# Patient Record
Sex: Female | Born: 1940 | Race: White | Hispanic: No
Health system: Southern US, Community
[De-identification: ages and names within clinical notes are randomized; demographics above are authoritative.]

---

## 2004-06-30 ENCOUNTER — Ambulatory Visit: Payer: Self-pay | Admitting: Internal Medicine

## 2004-10-11 ENCOUNTER — Ambulatory Visit: Payer: Self-pay | Admitting: Internal Medicine

## 2005-10-24 ENCOUNTER — Ambulatory Visit: Payer: Self-pay | Admitting: Internal Medicine

## 2005-12-12 ENCOUNTER — Ambulatory Visit: Payer: Self-pay | Admitting: Internal Medicine

## 2006-04-30 ENCOUNTER — Emergency Department: Payer: Self-pay | Admitting: Emergency Medicine

## 2007-01-02 ENCOUNTER — Ambulatory Visit: Payer: Self-pay | Admitting: Family Medicine

## 2007-01-17 ENCOUNTER — Ambulatory Visit: Payer: Self-pay | Admitting: Gastroenterology

## 2007-09-12 ENCOUNTER — Ambulatory Visit: Payer: Self-pay | Admitting: Unknown Physician Specialty

## 2007-09-19 ENCOUNTER — Ambulatory Visit: Payer: Self-pay | Admitting: Unknown Physician Specialty

## 2007-12-02 ENCOUNTER — Ambulatory Visit: Payer: Self-pay | Admitting: Family Medicine

## 2008-05-09 ENCOUNTER — Ambulatory Visit: Payer: Self-pay | Admitting: Urology

## 2008-11-11 ENCOUNTER — Ambulatory Visit: Payer: Self-pay | Admitting: Nurse Practitioner

## 2009-04-28 ENCOUNTER — Ambulatory Visit: Payer: Self-pay | Admitting: Urology

## 2009-07-11 ENCOUNTER — Ambulatory Visit: Payer: Self-pay | Admitting: Internal Medicine

## 2010-05-03 ENCOUNTER — Ambulatory Visit: Payer: Self-pay | Admitting: General Practice

## 2011-02-04 ENCOUNTER — Ambulatory Visit: Payer: Self-pay

## 2011-02-15 ENCOUNTER — Ambulatory Visit: Payer: Self-pay | Admitting: Gynecologic Oncology

## 2011-02-16 LAB — CEA: CEA: 3.8 ng/mL (ref 0.0–4.7)

## 2011-02-21 ENCOUNTER — Ambulatory Visit: Payer: Self-pay | Admitting: Gastroenterology

## 2011-02-23 ENCOUNTER — Ambulatory Visit: Payer: Self-pay | Admitting: Gynecologic Oncology

## 2011-02-23 LAB — CBC
HCT: 37.4 % (ref 35.0–47.0)
HGB: 12.7 g/dL (ref 12.0–16.0)
MCHC: 33.9 g/dL (ref 32.0–36.0)
MCV: 81 fL (ref 80–100)
Platelet: 310 10*3/uL (ref 150–440)
RBC: 4.62 10*6/uL (ref 3.80–5.20)
RDW: 14.9 % — ABNORMAL HIGH (ref 11.5–14.5)

## 2011-02-23 LAB — BASIC METABOLIC PANEL
Anion Gap: 9 (ref 7–16)
BUN: 13 mg/dL (ref 7–18)
Calcium, Total: 7.9 mg/dL — ABNORMAL LOW (ref 8.5–10.1)
Co2: 28 mmol/L (ref 21–32)
Creatinine: 0.96 mg/dL (ref 0.60–1.30)
EGFR (African American): >60
Potassium: 3.6 mmol/L (ref 3.5–5.1)
Sodium: 138 mmol/L (ref 136–145)

## 2011-02-24 ENCOUNTER — Ambulatory Visit: Payer: Self-pay | Admitting: Oncology

## 2011-03-01 ENCOUNTER — Inpatient Hospital Stay: Payer: Self-pay | Admitting: Surgery

## 2011-03-01 LAB — CBC WITH DIFFERENTIAL/PLATELET
Basophil #: 0 10*3/uL (ref 0.0–0.1)
Eosinophil #: 0 10*3/uL (ref 0.0–0.7)
Eosinophil %: 0 %
HCT: 39.4 % (ref 35.0–47.0)
Lymphocyte %: 4.8 %
MCH: 28 pg (ref 26.0–34.0)
MCHC: 33.7 g/dL (ref 32.0–36.0)
MCV: 83 fL (ref 80–100)
Monocyte %: 5.1 %
Neutrophil %: 89.8 %
Platelet: 342 10*3/uL (ref 150–440)
RBC: 4.73 10*6/uL (ref 3.80–5.20)
RDW: 15.5 % — ABNORMAL HIGH (ref 11.5–14.5)
WBC: 13.3 10*3/uL — ABNORMAL HIGH (ref 3.6–11.0)

## 2011-03-02 LAB — CBC WITH DIFFERENTIAL/PLATELET
Basophil #: 0 10*3/uL (ref 0.0–0.1)
Basophil %: 0 %
Eosinophil #: 0 10*3/uL (ref 0.0–0.7)
HCT: 32.1 % — ABNORMAL LOW (ref 35.0–47.0)
Lymphocyte #: 0.5 10*3/uL — ABNORMAL LOW (ref 1.0–3.6)
Lymphocyte %: 3.9 %
MCH: 28 pg (ref 26.0–34.0)
MCHC: 33.4 g/dL (ref 32.0–36.0)
MCV: 84 fL (ref 80–100)
Neutrophil #: 10.7 10*3/uL — ABNORMAL HIGH (ref 1.4–6.5)
Platelet: 283 10*3/uL (ref 150–440)
RDW: 15.7 % — ABNORMAL HIGH (ref 11.5–14.5)

## 2011-03-02 LAB — BASIC METABOLIC PANEL
Anion Gap: 12 (ref 7–16)
BUN: 12 mg/dL (ref 7–18)
Calcium, Total: 6.4 mg/dL — CL (ref 8.5–10.1)
Creatinine: 1.29 mg/dL (ref 0.60–1.30)
EGFR (African American): 52 — ABNORMAL LOW
EGFR (Non-African Amer.): 43 — ABNORMAL LOW
Glucose: 215 mg/dL — ABNORMAL HIGH (ref 65–99)
Osmolality: 295 (ref 275–301)

## 2011-03-03 LAB — CBC WITH DIFFERENTIAL/PLATELET
Basophil #: 0 10*3/uL (ref 0.0–0.1)
Basophil %: 0.2 %
Eosinophil #: 0.1 10*3/uL (ref 0.0–0.7)
HGB: 9.1 g/dL — ABNORMAL LOW (ref 12.0–16.0)
Lymphocyte %: 5.7 %
MCHC: 33.2 g/dL (ref 32.0–36.0)
MCV: 84 fL (ref 80–100)
Neutrophil %: 85.8 %
Platelet: 229 10*3/uL (ref 150–440)
RDW: 15.7 % — ABNORMAL HIGH (ref 11.5–14.5)

## 2011-03-03 LAB — BASIC METABOLIC PANEL WITH GFR
Anion Gap: 9
BUN: 9 mg/dL
Calcium, Total: 6.8 mg/dL — CL
Chloride: 109 mmol/L — ABNORMAL HIGH
Co2: 25 mmol/L
Creatinine: 0.89 mg/dL
EGFR (African American): >60
EGFR (Non-African Amer.): >60
Glucose: 127 mg/dL — ABNORMAL HIGH
Osmolality: 285
Potassium: 3.6 mmol/L
Sodium: 143 mmol/L

## 2011-03-04 LAB — CBC WITH DIFFERENTIAL/PLATELET
Basophil %: 0.3 %
Eosinophil %: 3.7 %
HCT: 27.9 % — ABNORMAL LOW (ref 35.0–47.0)
HGB: 9.2 g/dL — ABNORMAL LOW (ref 12.0–16.0)
Lymphocyte #: 0.7 10*3/uL — ABNORMAL LOW (ref 1.0–3.6)
Lymphocyte %: 6.9 %
MCH: 27.7 pg (ref 26.0–34.0)
Monocyte #: 0.6 10*3/uL (ref 0.0–0.7)
Monocyte %: 6 %
Neutrophil #: 8 10*3/uL — ABNORMAL HIGH (ref 1.4–6.5)
Neutrophil %: 83.1 %
Platelet: 237 10*3/uL (ref 150–440)
RBC: 3.32 10*6/uL — ABNORMAL LOW (ref 3.80–5.20)
WBC: 9.6 10*3/uL (ref 3.6–11.0)

## 2011-03-04 LAB — BASIC METABOLIC PANEL
Calcium, Total: 7.5 mg/dL — ABNORMAL LOW (ref 8.5–10.1)
Chloride: 108 mmol/L — ABNORMAL HIGH (ref 98–107)
Osmolality: 281 (ref 275–301)
Potassium: 3.3 mmol/L — ABNORMAL LOW (ref 3.5–5.1)

## 2011-03-05 LAB — CBC WITH DIFFERENTIAL/PLATELET
Basophil #: 0.1 10*3/uL (ref 0.0–0.1)
Basophil %: 0.6 %
Eosinophil #: 0.4 10*3/uL (ref 0.0–0.7)
HCT: 29.4 % — ABNORMAL LOW (ref 35.0–47.0)
HGB: 9.9 g/dL — ABNORMAL LOW (ref 12.0–16.0)
Lymphocyte #: 0.7 10*3/uL — ABNORMAL LOW (ref 1.0–3.6)
Lymphocyte %: 7.3 %
MCH: 27.9 pg (ref 26.0–34.0)
MCHC: 33.7 g/dL (ref 32.0–36.0)
Monocyte #: 0.6 10*3/uL (ref 0.0–0.7)
Neutrophil %: 80.8 %
Platelet: 269 10*3/uL (ref 150–440)
RBC: 3.55 10*6/uL — ABNORMAL LOW (ref 3.80–5.20)
RDW: 15.4 % — ABNORMAL HIGH (ref 11.5–14.5)
WBC: 9.5 10*3/uL (ref 3.6–11.0)

## 2011-03-05 LAB — BASIC METABOLIC PANEL
Anion Gap: 9 (ref 7–16)
BUN: 4 mg/dL — ABNORMAL LOW (ref 7–18)
Creatinine: 0.67 mg/dL (ref 0.60–1.30)
EGFR (Non-African Amer.): >60
Osmolality: 283 (ref 275–301)
Sodium: 143 mmol/L (ref 136–145)

## 2011-03-06 LAB — CBC WITH DIFFERENTIAL/PLATELET
Basophil %: 0.5 %
Eosinophil #: 0.5 10*3/uL (ref 0.0–0.7)
Eosinophil %: 5.2 %
HCT: 32.8 % — ABNORMAL LOW (ref 35.0–47.0)
HGB: 10.8 g/dL — ABNORMAL LOW (ref 12.0–16.0)
Lymphocyte #: 0.9 10*3/uL — ABNORMAL LOW (ref 1.0–3.6)
Lymphocyte %: 9.2 %
MCV: 83 fL (ref 80–100)
Monocyte #: 0.8 10*3/uL — ABNORMAL HIGH (ref 0.0–0.7)
Neutrophil %: 76.7 %
RDW: 15.6 % — ABNORMAL HIGH (ref 11.5–14.5)
WBC: 9.5 10*3/uL (ref 3.6–11.0)

## 2011-03-06 LAB — BASIC METABOLIC PANEL
Anion Gap: 7 (ref 7–16)
BUN: 4 mg/dL — ABNORMAL LOW (ref 7–18)
Chloride: 103 mmol/L (ref 98–107)
Co2: 31 mmol/L (ref 21–32)
Creatinine: 0.81 mg/dL (ref 0.60–1.30)
EGFR (Non-African Amer.): >60
Osmolality: 279 (ref 275–301)
Potassium: 3.6 mmol/L (ref 3.5–5.1)
Sodium: 141 mmol/L (ref 136–145)

## 2011-03-15 ENCOUNTER — Ambulatory Visit: Payer: Self-pay | Admitting: Gynecologic Oncology

## 2011-03-15 LAB — COMPREHENSIVE METABOLIC PANEL
Albumin: 2.9 g/dL — ABNORMAL LOW (ref 3.4–5.0)
Alkaline Phosphatase: 101 U/L (ref 50–136)
Anion Gap: 7 (ref 7–16)
Bilirubin,Total: 0.3 mg/dL (ref 0.2–1.0)
Calcium, Total: 7.9 mg/dL — ABNORMAL LOW (ref 8.5–10.1)
Chloride: 103 mmol/L (ref 98–107)
Co2: 33 mmol/L — ABNORMAL HIGH (ref 21–32)
Creatinine: 1.12 mg/dL (ref 0.60–1.30)
EGFR (African American): >60
Glucose: 101 mg/dL — ABNORMAL HIGH (ref 65–99)
Osmolality: 286 (ref 275–301)
Sodium: 143 mmol/L (ref 136–145)

## 2011-03-15 LAB — CBC CANCER CENTER
Basophil %: 0.5 %
Eosinophil %: 5 %
HCT: 33 % — ABNORMAL LOW (ref 35.0–47.0)
HGB: 11.1 g/dL — ABNORMAL LOW (ref 12.0–16.0)
Lymphocyte %: 9.3 %
MCH: 27.7 pg (ref 26.0–34.0)
MCHC: 33.6 g/dL (ref 32.0–36.0)
Monocyte #: 0.8 x10 3/mm — ABNORMAL HIGH (ref 0.0–0.7)
Neutrophil #: 9.1 x10 3/mm — ABNORMAL HIGH (ref 1.4–6.5)
Neutrophil %: 78.6 %
Platelet: 536 x10 3/mm — ABNORMAL HIGH (ref 150–440)
RBC: 3.99 10*6/uL (ref 3.80–5.20)

## 2011-03-16 LAB — CA 125: CA 125: 50 U/mL — ABNORMAL HIGH (ref 0.0–34.0)

## 2011-03-17 ENCOUNTER — Ambulatory Visit: Payer: Self-pay | Admitting: Surgery

## 2011-03-17 LAB — CBC CANCER CENTER
Basophil #: 0.1 x10 3/mm (ref 0.0–0.1)
Eosinophil #: 0.7 x10 3/mm (ref 0.0–0.7)
Lymphocyte #: 1.3 x10 3/mm (ref 1.0–3.6)
Lymphocyte %: 15.9 %
MCV: 83 fL (ref 80–100)
Monocyte %: 7.2 %
Neutrophil %: 67.9 %
Platelet: 525 x10 3/mm — ABNORMAL HIGH (ref 150–440)
RDW: 15.7 % — ABNORMAL HIGH (ref 11.5–14.5)
WBC: 8.2 x10 3/mm (ref 3.6–11.0)

## 2011-03-17 LAB — URINALYSIS, COMPLETE
Bilirubin,UR: NEGATIVE
Blood: NEGATIVE
Ketone: NEGATIVE
Ph: 6 (ref 4.5–8.0)
Protein: 30
RBC,UR: 4 /HPF (ref 0–5)
Specific Gravity: 1.024 (ref 1.003–1.030)
Squamous Epithelial: 51
Transitional Epi: 1
WBC UR: 18 /HPF (ref 0–5)

## 2011-03-17 LAB — COMPREHENSIVE METABOLIC PANEL
Anion Gap: 7 (ref 7–16)
BUN: 15 mg/dL (ref 7–18)
Calcium, Total: 8 mg/dL — ABNORMAL LOW (ref 8.5–10.1)
Chloride: 104 mmol/L (ref 98–107)
Co2: 33 mmol/L — ABNORMAL HIGH (ref 21–32)
EGFR (African American): >60
EGFR (Non-African Amer.): 50 — ABNORMAL LOW
SGOT(AST): 23 U/L (ref 15–37)
SGPT (ALT): 32 U/L

## 2011-03-18 ENCOUNTER — Ambulatory Visit: Payer: Self-pay | Admitting: Oncology

## 2011-03-18 LAB — PROTIME-INR
INR: 0.9
Prothrombin Time: 12.9 secs (ref 11.5–14.7)

## 2011-03-21 LAB — MAGNESIUM: Magnesium: 2.2 mg/dL

## 2011-03-21 LAB — BILIRUBIN, DIRECT: Bilirubin, Direct: 0.1 mg/dL (ref 0.00–0.20)

## 2011-03-25 LAB — CA 125: CA 125: 70.3 U/mL — ABNORMAL HIGH (ref 0.0–34.0)

## 2011-04-14 LAB — CBC CANCER CENTER
Basophil %: 0 %
Eosinophil #: 0 x10 3/mm (ref 0.0–0.7)
Eosinophil %: 0 %
HCT: 33.9 % — ABNORMAL LOW (ref 35.0–47.0)
Lymphocyte #: 0.7 x10 3/mm — ABNORMAL LOW (ref 1.0–3.6)
Lymphocyte %: 7.6 %
MCHC: 33.6 g/dL (ref 32.0–36.0)
Monocyte #: 0.1 x10 3/mm (ref 0.0–0.7)
Monocyte %: 1.1 %
Neutrophil #: 8 x10 3/mm — ABNORMAL HIGH (ref 1.4–6.5)
Neutrophil %: 91.3 %
RDW: 15.4 % — ABNORMAL HIGH (ref 11.5–14.5)

## 2011-04-14 LAB — COMPREHENSIVE METABOLIC PANEL
Albumin: 3.4 g/dL (ref 3.4–5.0)
Alkaline Phosphatase: 99 U/L (ref 50–136)
Anion Gap: 14 (ref 7–16)
BUN: 14 mg/dL (ref 7–18)
Bilirubin,Total: 0.3 mg/dL (ref 0.2–1.0)
Calcium, Total: 8.5 mg/dL (ref 8.5–10.1)
Co2: 26 mmol/L (ref 21–32)
Creatinine: 1.13 mg/dL (ref 0.60–1.30)
EGFR (Non-African Amer.): 50 — ABNORMAL LOW
Osmolality: 292 (ref 275–301)
SGOT(AST): 17 U/L (ref 15–37)
SGPT (ALT): 32 U/L

## 2011-04-14 LAB — MAGNESIUM: Magnesium: 2 mg/dL

## 2011-04-14 LAB — BILIRUBIN, DIRECT: Bilirubin, Direct: 0.1 mg/dL (ref 0.00–0.20)

## 2011-04-18 ENCOUNTER — Ambulatory Visit: Payer: Self-pay | Admitting: Oncology

## 2011-05-05 LAB — CBC CANCER CENTER
Basophil %: 0.2 %
Eosinophil #: 0 x10 3/mm (ref 0.0–0.7)
Eosinophil %: 0 %
HCT: 36.3 % (ref 35.0–47.0)
HGB: 11.5 g/dL — ABNORMAL LOW (ref 12.0–16.0)
Lymphocyte %: 8.3 %
MCH: 24.7 pg — ABNORMAL LOW (ref 26.0–34.0)
MCHC: 31.6 g/dL — ABNORMAL LOW (ref 32.0–36.0)
MCV: 78 fL — ABNORMAL LOW (ref 80–100)
Monocyte %: 0.7 %
Neutrophil #: 6.3 x10 3/mm (ref 1.4–6.5)
Neutrophil %: 90.8 %
Platelet: 417 x10 3/mm (ref 150–440)
RDW: 16.3 % — ABNORMAL HIGH (ref 11.5–14.5)
WBC: 6.9 x10 3/mm (ref 3.6–11.0)

## 2011-05-05 LAB — COMPREHENSIVE METABOLIC PANEL
Albumin: 3.6 g/dL (ref 3.4–5.0)
BUN: 15 mg/dL (ref 7–18)
Bilirubin,Total: 0.4 mg/dL (ref 0.2–1.0)
Calcium, Total: 8.8 mg/dL (ref 8.5–10.1)
Co2: 24 mmol/L (ref 21–32)
Creatinine: 1.16 mg/dL (ref 0.60–1.30)
EGFR (African American): 54 — ABNORMAL LOW
EGFR (Non-African Amer.): 47 — ABNORMAL LOW
Glucose: 243 mg/dL — ABNORMAL HIGH (ref 65–99)
Osmolality: 292 (ref 275–301)
Potassium: 4 mmol/L (ref 3.5–5.1)
SGOT(AST): 18 U/L (ref 15–37)
Sodium: 142 mmol/L (ref 136–145)

## 2011-05-05 LAB — URINALYSIS, COMPLETE
Glucose,UR: NEGATIVE mg/dL (ref 0–75)
Ph: 5 (ref 4.5–8.0)
Squamous Epithelial: 10
WBC UR: 5 /HPF (ref 0–5)

## 2011-05-05 LAB — BILIRUBIN, DIRECT: Bilirubin, Direct: <0.1 mg/dL (ref 0.00–0.20)

## 2011-05-05 LAB — MAGNESIUM: Magnesium: 1.7 mg/dL — ABNORMAL LOW

## 2011-05-18 ENCOUNTER — Ambulatory Visit: Payer: Self-pay | Admitting: Oncology

## 2011-05-25 LAB — CBC CANCER CENTER
Basophil #: 0.1 x10 3/mm (ref 0.0–0.1)
Basophil %: 1.2 %
Eosinophil #: 0 x10 3/mm (ref 0.0–0.7)
HGB: 11.3 g/dL — ABNORMAL LOW (ref 12.0–16.0)
MCH: 25.5 pg — ABNORMAL LOW (ref 26.0–34.0)
MCV: 77 fL — ABNORMAL LOW (ref 80–100)
Monocyte #: 0.6 x10 3/mm (ref 0.2–0.9)
Monocyte %: 10.1 %
Neutrophil #: 3.9 x10 3/mm (ref 1.4–6.5)
Neutrophil %: 65.5 %
Platelet: 301 x10 3/mm (ref 150–440)
RBC: 4.45 10*6/uL (ref 3.80–5.20)

## 2011-05-25 LAB — COMPREHENSIVE METABOLIC PANEL
Albumin: 3.4 g/dL (ref 3.4–5.0)
Anion Gap: 11 (ref 7–16)
BUN: 8 mg/dL (ref 7–18)
Calcium, Total: 8.1 mg/dL — ABNORMAL LOW (ref 8.5–10.1)
Creatinine: 0.94 mg/dL (ref 0.60–1.30)
EGFR (Non-African Amer.): >60
Glucose: 109 mg/dL — ABNORMAL HIGH (ref 65–99)
Osmolality: 286 (ref 275–301)
Potassium: 3.4 mmol/L — ABNORMAL LOW (ref 3.5–5.1)
SGOT(AST): 19 U/L (ref 15–37)
SGPT (ALT): 28 U/L
Sodium: 144 mmol/L (ref 136–145)
Total Protein: 6.5 g/dL (ref 6.4–8.2)

## 2011-05-25 LAB — MAGNESIUM: Magnesium: 1.9 mg/dL

## 2011-05-25 LAB — BILIRUBIN, DIRECT: Bilirubin, Direct: 0.1 mg/dL (ref 0.00–0.20)

## 2011-06-16 LAB — COMPREHENSIVE METABOLIC PANEL
Albumin: 3.4 g/dL (ref 3.4–5.0)
Alkaline Phosphatase: 120 U/L (ref 50–136)
BUN: 18 mg/dL (ref 7–18)
Calcium, Total: 8.5 mg/dL (ref 8.5–10.1)
Chloride: 101 mmol/L (ref 98–107)
Co2: 27 mmol/L (ref 21–32)
Creatinine: 0.93 mg/dL (ref 0.60–1.30)
EGFR (African American): >60
EGFR (Non-African Amer.): >60
Glucose: 211 mg/dL — ABNORMAL HIGH (ref 65–99)
Osmolality: 286 (ref 275–301)
Potassium: 4.1 mmol/L (ref 3.5–5.1)
SGPT (ALT): 32 U/L
Total Protein: 7.1 g/dL (ref 6.4–8.2)

## 2011-06-16 LAB — PHOSPHORUS: Phosphorus: 3.4 mg/dL (ref 2.5–4.9)

## 2011-06-16 LAB — BILIRUBIN, DIRECT: Bilirubin, Direct: 0.1 mg/dL (ref 0.00–0.20)

## 2011-06-16 LAB — URINALYSIS, COMPLETE
Bilirubin,UR: NEGATIVE
Blood: NEGATIVE
Glucose,UR: >=500 mg/dL (ref 0–75)
Leukocyte Esterase: NEGATIVE
Nitrite: NEGATIVE
Ph: 5 (ref 4.5–8.0)
Protein: NEGATIVE
Specific Gravity: 1.02 (ref 1.003–1.030)
Squamous Epithelial: 2

## 2011-06-16 LAB — CBC CANCER CENTER
Eosinophil #: 0 x10 3/mm (ref 0.0–0.7)
Eosinophil %: 0 %
HCT: 34.7 % — ABNORMAL LOW (ref 35.0–47.0)
HGB: 11.2 g/dL — ABNORMAL LOW (ref 12.0–16.0)
MCH: 24.7 pg — ABNORMAL LOW (ref 26.0–34.0)
MCHC: 32.2 g/dL (ref 32.0–36.0)
MCV: 77 fL — ABNORMAL LOW (ref 80–100)
Monocyte #: 0.1 x10 3/mm — ABNORMAL LOW (ref 0.2–0.9)
Neutrophil %: 91.3 %
RBC: 4.53 10*6/uL (ref 3.80–5.20)
RDW: 18.7 % — ABNORMAL HIGH (ref 11.5–14.5)

## 2011-06-16 LAB — MAGNESIUM: Magnesium: 1.7 mg/dL — ABNORMAL LOW

## 2011-06-17 LAB — CA 125: CA 125: 12 U/mL (ref 0.0–34.0)

## 2011-06-18 ENCOUNTER — Ambulatory Visit: Payer: Self-pay | Admitting: Oncology

## 2011-07-07 LAB — CBC CANCER CENTER
Basophil #: 0.1 x10 3/mm (ref 0.0–0.1)
Eosinophil %: 0.4 %
HCT: 34.2 % — ABNORMAL LOW (ref 35.0–47.0)
HGB: 11.2 g/dL — ABNORMAL LOW (ref 12.0–16.0)
Lymphocyte #: 1 x10 3/mm (ref 1.0–3.6)
Lymphocyte %: 14.6 %
MCV: 79 fL — ABNORMAL LOW (ref 80–100)
Monocyte %: 5.8 %
Neutrophil #: 5.4 x10 3/mm (ref 1.4–6.5)
Neutrophil %: 78.5 %
Platelet: 202 x10 3/mm (ref 150–440)
RDW: 19.5 % — ABNORMAL HIGH (ref 11.5–14.5)
WBC: 6.9 x10 3/mm (ref 3.6–11.0)

## 2011-07-07 LAB — COMPREHENSIVE METABOLIC PANEL
Albumin: 3.4 g/dL (ref 3.4–5.0)
Anion Gap: 8 (ref 7–16)
Bilirubin,Total: 0.3 mg/dL (ref 0.2–1.0)
Creatinine: 0.79 mg/dL (ref 0.60–1.30)
Osmolality: 282 (ref 275–301)
Potassium: 3.9 mmol/L (ref 3.5–5.1)
SGOT(AST): 27 U/L (ref 15–37)
SGPT (ALT): 32 U/L
Sodium: 141 mmol/L (ref 136–145)
Total Protein: 6.7 g/dL (ref 6.4–8.2)

## 2011-07-07 LAB — MAGNESIUM: Magnesium: 1.9 mg/dL

## 2011-07-07 LAB — BILIRUBIN, DIRECT: Bilirubin, Direct: <0.1 mg/dL (ref 0.00–0.20)

## 2011-07-18 ENCOUNTER — Ambulatory Visit: Payer: Self-pay | Admitting: Oncology

## 2011-07-27 LAB — CREATININE, SERUM: Creatinine: 0.89 mg/dL (ref 0.60–1.30)

## 2011-07-28 LAB — PHOSPHORUS: Phosphorus: 4.4 mg/dL (ref 2.5–4.9)

## 2011-07-28 LAB — COMPREHENSIVE METABOLIC PANEL
Albumin: 3.5 g/dL (ref 3.4–5.0)
Alkaline Phosphatase: 117 U/L (ref 50–136)
BUN: 13 mg/dL (ref 7–18)
Chloride: 102 mmol/L (ref 98–107)
Co2: 33 mmol/L — ABNORMAL HIGH (ref 21–32)
Creatinine: 0.88 mg/dL (ref 0.60–1.30)
EGFR (Non-African Amer.): >60
Glucose: 84 mg/dL (ref 65–99)
Osmolality: 284 (ref 275–301)
SGOT(AST): 16 U/L (ref 15–37)
SGPT (ALT): 26 U/L
Sodium: 143 mmol/L (ref 136–145)
Total Protein: 6.8 g/dL (ref 6.4–8.2)

## 2011-07-28 LAB — URINALYSIS, COMPLETE
Blood: NEGATIVE
Glucose,UR: NEGATIVE mg/dL (ref 0–75)
RBC,UR: 5 /HPF (ref 0–5)
Squamous Epithelial: 40

## 2011-07-28 LAB — CBC CANCER CENTER
Basophil %: 0.7 %
Eosinophil #: 0 x10 3/mm (ref 0.0–0.7)
Eosinophil %: 0.3 %
HCT: 33.5 % — ABNORMAL LOW (ref 35.0–47.0)
HGB: 11 g/dL — ABNORMAL LOW (ref 12.0–16.0)
Lymphocyte #: 1.3 x10 3/mm (ref 1.0–3.6)
MCHC: 32.9 g/dL (ref 32.0–36.0)
MCV: 80 fL (ref 80–100)
Monocyte %: 12.9 %
Neutrophil #: 3.1 x10 3/mm (ref 1.4–6.5)
Platelet: 207 x10 3/mm (ref 150–440)
RBC: 4.22 10*6/uL (ref 3.80–5.20)
WBC: 5.1 x10 3/mm (ref 3.6–11.0)

## 2011-07-28 LAB — BILIRUBIN, DIRECT: Bilirubin, Direct: 0.1 mg/dL (ref 0.00–0.20)

## 2011-08-03 LAB — PROTEIN, URINE, 24 HOUR: Protein, Urine: <5 mg/dL — ABNORMAL LOW (ref 0–12)

## 2011-08-18 ENCOUNTER — Ambulatory Visit: Payer: Self-pay | Admitting: Oncology

## 2011-08-25 LAB — BASIC METABOLIC PANEL
Anion Gap: 11 (ref 7–16)
BUN: 16 mg/dL (ref 7–18)
Calcium, Total: 8.6 mg/dL (ref 8.5–10.1)
Chloride: 102 mmol/L (ref 98–107)
Co2: 30 mmol/L (ref 21–32)
Creatinine: 1.04 mg/dL (ref 0.60–1.30)
EGFR (African American): >60
EGFR (Non-African Amer.): 53 — ABNORMAL LOW
Glucose: 120 mg/dL — ABNORMAL HIGH (ref 65–99)
Sodium: 143 mmol/L (ref 136–145)

## 2011-09-01 LAB — POTASSIUM: Potassium: 3.8 mmol/L (ref 3.5–5.1)

## 2011-09-15 ENCOUNTER — Ambulatory Visit: Payer: Self-pay | Admitting: Internal Medicine

## 2011-09-18 ENCOUNTER — Ambulatory Visit: Payer: Self-pay | Admitting: Oncology

## 2011-09-22 LAB — BASIC METABOLIC PANEL
Anion Gap: 6 — ABNORMAL LOW (ref 7–16)
Calcium, Total: 8.2 mg/dL — ABNORMAL LOW (ref 8.5–10.1)
Chloride: 103 mmol/L (ref 98–107)
Co2: 32 mmol/L (ref 21–32)
Creatinine: 0.98 mg/dL (ref 0.60–1.30)
EGFR (African American): >60
Osmolality: 282 (ref 275–301)

## 2011-09-22 LAB — CBC CANCER CENTER
Basophil #: 0.1 x10 3/mm (ref 0.0–0.1)
HCT: 35.5 % (ref 35.0–47.0)
Lymphocyte #: 1.4 x10 3/mm (ref 1.0–3.6)
Lymphocyte %: 23 %
MCHC: 32.6 g/dL (ref 32.0–36.0)
MCV: 79 fL — ABNORMAL LOW (ref 80–100)
Monocyte %: 8.7 %
Neutrophil %: 61.1 %
Platelet: 235 x10 3/mm (ref 150–440)
RBC: 4.48 10*6/uL (ref 3.80–5.20)
RDW: 15.7 % — ABNORMAL HIGH (ref 11.5–14.5)
WBC: 6 x10 3/mm (ref 3.6–11.0)

## 2011-09-29 LAB — POTASSIUM: Potassium: 3.8 mmol/L (ref 3.5–5.1)

## 2011-10-07 LAB — CA 125: CA 125: 8.5 U/mL (ref 0.0–34.0)

## 2011-10-18 ENCOUNTER — Ambulatory Visit: Payer: Self-pay | Admitting: Oncology

## 2011-10-20 LAB — URINALYSIS, COMPLETE
Bacteria: NONE SEEN
Bilirubin,UR: NEGATIVE
Blood: NEGATIVE
Glucose,UR: NEGATIVE mg/dL (ref 0–75)
Leukocyte Esterase: NEGATIVE
Nitrite: NEGATIVE
Ph: 6 (ref 4.5–8.0)
Squamous Epithelial: <1
WBC UR: 2 /HPF (ref 0–5)

## 2011-10-20 LAB — CBC CANCER CENTER
Basophil %: 1 %
Eosinophil %: 3.9 %
HCT: 35.5 % (ref 35.0–47.0)
HGB: 11.6 g/dL — ABNORMAL LOW (ref 12.0–16.0)
Lymphocyte #: 1.3 x10 3/mm (ref 1.0–3.6)
MCH: 25.6 pg — ABNORMAL LOW (ref 26.0–34.0)
MCV: 78 fL — ABNORMAL LOW (ref 80–100)
Monocyte #: 0.5 x10 3/mm (ref 0.2–0.9)
RBC: 4.53 10*6/uL (ref 3.80–5.20)

## 2011-10-20 LAB — COMPREHENSIVE METABOLIC PANEL
Alkaline Phosphatase: 98 U/L (ref 50–136)
Anion Gap: 14 (ref 7–16)
Bilirubin,Total: 0.3 mg/dL (ref 0.2–1.0)
Calcium, Total: 8.4 mg/dL — ABNORMAL LOW (ref 8.5–10.1)
Chloride: 105 mmol/L (ref 98–107)
EGFR (African American): >60
EGFR (Non-African Amer.): >60
Glucose: 90 mg/dL (ref 65–99)
Osmolality: 288 (ref 275–301)
Potassium: 3.4 mmol/L — ABNORMAL LOW (ref 3.5–5.1)
SGOT(AST): 12 U/L — ABNORMAL LOW (ref 15–37)
SGPT (ALT): 18 U/L (ref 12–78)

## 2011-10-20 LAB — BILIRUBIN, DIRECT: Bilirubin, Direct: 0.1 mg/dL (ref 0.00–0.20)

## 2011-10-20 LAB — PHOSPHORUS: Phosphorus: 4.3 mg/dL (ref 2.5–4.9)

## 2011-10-21 LAB — CA 125: CA 125: 8.1 U/mL (ref 0.0–34.0)

## 2011-11-17 LAB — POTASSIUM: Potassium: 3.7 mmol/L (ref 3.5–5.1)

## 2011-11-18 ENCOUNTER — Ambulatory Visit: Payer: Self-pay | Admitting: Oncology

## 2011-12-14 LAB — POTASSIUM: Potassium: 3.3 mmol/L — ABNORMAL LOW (ref 3.5–5.1)

## 2011-12-18 ENCOUNTER — Ambulatory Visit: Payer: Self-pay | Admitting: Oncology

## 2012-01-05 LAB — POTASSIUM: Potassium: 3.6 mmol/L (ref 3.5–5.1)

## 2012-01-09 LAB — CREATININE, SERUM
EGFR (African American): >60
EGFR (Non-African Amer.): >60

## 2012-01-12 LAB — COMPREHENSIVE METABOLIC PANEL
Alkaline Phosphatase: 108 U/L (ref 50–136)
Anion Gap: 10 (ref 7–16)
Bilirubin,Total: 0.4 mg/dL (ref 0.2–1.0)
Calcium, Total: 8.1 mg/dL — ABNORMAL LOW (ref 8.5–10.1)
Chloride: 105 mmol/L (ref 98–107)
Co2: 29 mmol/L (ref 21–32)
EGFR (African American): >60
EGFR (Non-African Amer.): >60
Glucose: 90 mg/dL (ref 65–99)
Potassium: 3.5 mmol/L (ref 3.5–5.1)
SGOT(AST): 14 U/L — ABNORMAL LOW (ref 15–37)
SGPT (ALT): 22 U/L (ref 12–78)
Total Protein: 6.3 g/dL — ABNORMAL LOW (ref 6.4–8.2)

## 2012-01-12 LAB — CBC CANCER CENTER
Basophil #: 0.1 x10 3/mm (ref 0.0–0.1)
Basophil %: 0.9 %
Eosinophil #: 0.3 x10 3/mm (ref 0.0–0.7)
Lymphocyte #: 1.3 x10 3/mm (ref 1.0–3.6)
Lymphocyte %: 22.4 %
MCHC: 33.3 g/dL (ref 32.0–36.0)
MCV: 76 fL — ABNORMAL LOW (ref 80–100)
Monocyte #: 0.5 x10 3/mm (ref 0.2–0.9)
Monocyte %: 8.7 %
Neutrophil #: 3.5 x10 3/mm (ref 1.4–6.5)
Platelet: 230 x10 3/mm (ref 150–440)
RBC: 4.63 10*6/uL (ref 3.80–5.20)
RDW: 15.4 % — ABNORMAL HIGH (ref 11.5–14.5)
WBC: 5.7 x10 3/mm (ref 3.6–11.0)

## 2012-01-12 LAB — URINALYSIS, COMPLETE
Bilirubin,UR: NEGATIVE
Blood: NEGATIVE
Glucose,UR: NEGATIVE mg/dL (ref 0–75)
Leukocyte Esterase: NEGATIVE
Nitrite: NEGATIVE
Ph: 6 (ref 4.5–8.0)
Protein: NEGATIVE
RBC,UR: 1 /HPF (ref 0–5)
Squamous Epithelial: 2

## 2012-01-12 LAB — MAGNESIUM: Magnesium: 1.9 mg/dL

## 2012-01-12 LAB — BILIRUBIN, DIRECT: Bilirubin, Direct: 0.1 mg/dL (ref 0.00–0.20)

## 2012-01-18 ENCOUNTER — Ambulatory Visit: Payer: Self-pay | Admitting: Oncology

## 2012-02-18 ENCOUNTER — Ambulatory Visit: Payer: Self-pay | Admitting: Oncology

## 2012-03-08 LAB — POTASSIUM: Potassium: 4.2 mmol/L (ref 3.5–5.1)

## 2012-03-17 ENCOUNTER — Ambulatory Visit: Payer: Self-pay | Admitting: Oncology

## 2012-03-29 LAB — BASIC METABOLIC PANEL
Calcium, Total: 7.7 mg/dL — ABNORMAL LOW (ref 8.5–10.1)
Chloride: 104 mmol/L (ref 98–107)
Co2: 32 mmol/L (ref 21–32)
Creatinine: 1.04 mg/dL (ref 0.60–1.30)
EGFR (African American): >60
Glucose: 96 mg/dL (ref 65–99)

## 2012-04-11 ENCOUNTER — Ambulatory Visit: Payer: Self-pay | Admitting: Oncology

## 2012-04-12 LAB — COMPREHENSIVE METABOLIC PANEL
Alkaline Phosphatase: 131 U/L (ref 50–136)
Anion Gap: 6 — ABNORMAL LOW (ref 7–16)
BUN: 12 mg/dL (ref 7–18)
Bilirubin,Total: 0.4 mg/dL (ref 0.2–1.0)
Calcium, Total: 8.4 mg/dL — ABNORMAL LOW (ref 8.5–10.1)
Chloride: 105 mmol/L (ref 98–107)
Creatinine: 0.99 mg/dL (ref 0.60–1.30)
EGFR (Non-African Amer.): 57 — ABNORMAL LOW
Glucose: 161 mg/dL — ABNORMAL HIGH (ref 65–99)
Potassium: 3.1 mmol/L — ABNORMAL LOW (ref 3.5–5.1)
SGOT(AST): 27 U/L (ref 15–37)

## 2012-04-12 LAB — URINALYSIS, COMPLETE
Glucose,UR: NEGATIVE mg/dL (ref 0–75)
Ketone: NEGATIVE
Leukocyte Esterase: NEGATIVE
Nitrite: NEGATIVE
RBC,UR: 5 /HPF (ref 0–5)

## 2012-04-12 LAB — CBC CANCER CENTER
Eosinophil %: 2.1 %
HGB: 11.5 g/dL — ABNORMAL LOW (ref 12.0–16.0)
Lymphocyte %: 13.1 %
MCV: 75 fL — ABNORMAL LOW (ref 80–100)
Monocyte #: 0.3 x10 3/mm (ref 0.2–0.9)
Neutrophil #: 4.2 x10 3/mm (ref 1.4–6.5)
Neutrophil %: 77.5 %
RBC: 4.61 10*6/uL (ref 3.80–5.20)
WBC: 5.5 x10 3/mm (ref 3.6–11.0)

## 2012-04-12 LAB — BILIRUBIN, DIRECT: Bilirubin, Direct: <0.1 mg/dL (ref 0.00–0.20)

## 2012-04-12 LAB — PHOSPHORUS: Phosphorus: 4.1 mg/dL (ref 2.5–4.9)

## 2012-04-17 ENCOUNTER — Ambulatory Visit: Payer: Self-pay | Admitting: Oncology

## 2012-04-30 LAB — URINALYSIS, COMPLETE
Leukocyte Esterase: NEGATIVE
Nitrite: NEGATIVE
Ph: 6 (ref 4.5–8.0)
Protein: 30
RBC,UR: 3 /HPF (ref 0–5)
Specific Gravity: 1.025 (ref 1.003–1.030)
Squamous Epithelial: 4
WBC UR: 2 /HPF (ref 0–5)

## 2012-04-30 LAB — MAGNESIUM: Magnesium: 1.8 mg/dL

## 2012-04-30 LAB — COMPREHENSIVE METABOLIC PANEL
Albumin: 3.1 g/dL — ABNORMAL LOW (ref 3.4–5.0)
Alkaline Phosphatase: 147 U/L — ABNORMAL HIGH (ref 50–136)
BUN: 11 mg/dL (ref 7–18)
Bilirubin,Total: 0.7 mg/dL (ref 0.2–1.0)
Calcium, Total: 8.1 mg/dL — ABNORMAL LOW (ref 8.5–10.1)
Chloride: 102 mmol/L (ref 98–107)
Creatinine: 0.98 mg/dL (ref 0.60–1.30)
EGFR (African American): >60
EGFR (Non-African Amer.): 57 — ABNORMAL LOW
Glucose: 95 mg/dL (ref 65–99)
SGOT(AST): 21 U/L (ref 15–37)
Sodium: 139 mmol/L (ref 136–145)

## 2012-04-30 LAB — PHOSPHORUS: Phosphorus: 4.2 mg/dL (ref 2.5–4.9)

## 2012-04-30 LAB — CBC CANCER CENTER
Eosinophil #: 0.2 x10 3/mm (ref 0.0–0.7)
HCT: 35.2 % (ref 35.0–47.0)
HGB: 11.4 g/dL — ABNORMAL LOW (ref 12.0–16.0)
Lymphocyte #: 0.8 x10 3/mm — ABNORMAL LOW (ref 1.0–3.6)
MCH: 24.3 pg — ABNORMAL LOW (ref 26.0–34.0)
MCHC: 32.3 g/dL (ref 32.0–36.0)
MCV: 75 fL — ABNORMAL LOW (ref 80–100)
Monocyte #: 0.7 x10 3/mm (ref 0.2–0.9)
Monocyte %: 9.5 %
Neutrophil #: 5.2 x10 3/mm (ref 1.4–6.5)
Neutrophil %: 75.5 %
RBC: 4.69 10*6/uL (ref 3.80–5.20)
RDW: 16.4 % — ABNORMAL HIGH (ref 11.5–14.5)

## 2012-04-30 LAB — PROTIME-INR
INR: 1.1
Prothrombin Time: 14 secs (ref 11.5–14.7)

## 2012-04-30 LAB — APTT: Activated PTT: 33.7 secs (ref 23.6–35.9)

## 2012-04-30 LAB — BILIRUBIN, DIRECT: Bilirubin, Direct: 0.2 mg/dL (ref 0.00–0.20)

## 2012-05-01 LAB — CA 125: CA 125: 157.3 U/mL — ABNORMAL HIGH (ref 0.0–34.0)

## 2012-05-02 LAB — PROTIME-INR
INR: 1.1
Prothrombin Time: 14.1 secs (ref 11.5–14.7)

## 2012-05-04 ENCOUNTER — Emergency Department: Payer: Self-pay | Admitting: Emergency Medicine

## 2012-05-04 LAB — CBC
HCT: 35.1 % (ref 35.0–47.0)
HGB: 11.5 g/dL — ABNORMAL LOW (ref 12.0–16.0)
MCH: 25 pg — ABNORMAL LOW (ref 26.0–34.0)
MCHC: 32.9 g/dL (ref 32.0–36.0)
MCV: 76 fL — ABNORMAL LOW (ref 80–100)
Platelet: 270 10*3/uL (ref 150–440)
RDW: 16 % — ABNORMAL HIGH (ref 11.5–14.5)
WBC: 7.5 10*3/uL (ref 3.6–11.0)

## 2012-05-04 LAB — COMPREHENSIVE METABOLIC PANEL
Albumin: 2.6 g/dL — ABNORMAL LOW (ref 3.4–5.0)
Alkaline Phosphatase: 127 U/L (ref 50–136)
Bilirubin,Total: 0.9 mg/dL (ref 0.2–1.0)
Chloride: 104 mmol/L (ref 98–107)
Co2: 26 mmol/L (ref 21–32)
EGFR (Non-African Amer.): >60
Osmolality: 275 (ref 275–301)
SGOT(AST): 34 U/L (ref 15–37)
SGPT (ALT): 31 U/L (ref 12–78)
Sodium: 137 mmol/L (ref 136–145)
Total Protein: 5.9 g/dL — ABNORMAL LOW (ref 6.4–8.2)

## 2012-05-04 LAB — LACTATE DEHYDROGENASE, PLEURAL OR PERITONEAL FLUID: LDH, Body Fluid: 203 U/L

## 2012-05-04 LAB — TROPONIN I: Troponin-I: <0.02 ng/mL

## 2012-05-04 LAB — PRO B NATRIURETIC PEPTIDE: B-Type Natriuretic Peptide: 831 pg/mL — ABNORMAL HIGH (ref 0–125)

## 2012-05-04 LAB — PROTEIN, BODY FLUID: Protein, Body Fluid: 3.8 g/dL

## 2012-05-04 LAB — LACTATE DEHYDROGENASE: LDH: 205 U/L (ref 81–246)

## 2012-05-08 LAB — BODY FLUID CULTURE

## 2012-05-09 LAB — CBC CANCER CENTER
Basophil #: 0 x10 3/mm (ref 0.0–0.1)
Eosinophil #: 0.1 x10 3/mm (ref 0.0–0.7)
Eosinophil %: 1.5 %
MCH: 24.4 pg — ABNORMAL LOW (ref 26.0–34.0)
MCHC: 32.9 g/dL (ref 32.0–36.0)
MCV: 74 fL — ABNORMAL LOW (ref 80–100)
Monocyte #: 0.3 x10 3/mm (ref 0.2–0.9)
Platelet: 105 x10 3/mm — ABNORMAL LOW (ref 150–440)
RBC: 4.59 10*6/uL (ref 3.80–5.20)
RDW: 15.9 % — ABNORMAL HIGH (ref 11.5–14.5)

## 2012-05-10 LAB — CBC CANCER CENTER
Basophil #: 0 x10 3/mm (ref 0.0–0.1)
Basophil %: 0.8 %
Eosinophil #: 0.1 x10 3/mm (ref 0.0–0.7)
Eosinophil %: 1.1 %
HCT: 33.4 % — ABNORMAL LOW (ref 35.0–47.0)
Lymphocyte #: 1.1 x10 3/mm (ref 1.0–3.6)
Lymphocyte %: 17.9 %
MCV: 75 fL — ABNORMAL LOW (ref 80–100)
Neutrophil #: 4.3 x10 3/mm (ref 1.4–6.5)
Platelet: 97 x10 3/mm — ABNORMAL LOW (ref 150–440)
RBC: 4.47 10*6/uL (ref 3.80–5.20)
RDW: 16 % — ABNORMAL HIGH (ref 11.5–14.5)
WBC: 6 x10 3/mm (ref 3.6–11.0)

## 2012-05-17 ENCOUNTER — Ambulatory Visit: Payer: Self-pay | Admitting: Oncology

## 2012-05-18 ENCOUNTER — Ambulatory Visit: Payer: Self-pay | Admitting: Oncology

## 2012-05-18 LAB — COMPREHENSIVE METABOLIC PANEL
BUN: 12 mg/dL (ref 7–18)
Bilirubin,Total: 0.4 mg/dL (ref 0.2–1.0)
Calcium, Total: 7.5 mg/dL — ABNORMAL LOW (ref 8.5–10.1)
Chloride: 102 mmol/L (ref 98–107)
Co2: 33 mmol/L — ABNORMAL HIGH (ref 21–32)
EGFR (African American): >60
EGFR (Non-African Amer.): 57 — ABNORMAL LOW
Osmolality: 283 (ref 275–301)
Sodium: 141 mmol/L (ref 136–145)

## 2012-05-18 LAB — CBC CANCER CENTER
Basophil #: 0 x10 3/mm (ref 0.0–0.1)
Basophil %: 1 %
Eosinophil #: 0 x10 3/mm (ref 0.0–0.7)
Eosinophil %: 1.2 %
Lymphocyte %: 15.2 %
MCV: 76 fL — ABNORMAL LOW (ref 80–100)
Monocyte #: 0.8 x10 3/mm (ref 0.2–0.9)
Monocyte %: 18.3 %
Neutrophil %: 64.3 %
RBC: 4.24 10*6/uL (ref 3.80–5.20)

## 2012-05-19 LAB — CA 125: CA 125: 167.4 U/mL — ABNORMAL HIGH (ref 0.0–34.0)

## 2012-05-23 ENCOUNTER — Inpatient Hospital Stay: Payer: Self-pay | Admitting: Oncology

## 2012-05-23 LAB — CBC
HCT: 39.3 % (ref 35.0–47.0)
MCH: 24.5 pg — ABNORMAL LOW (ref 26.0–34.0)
Platelet: 471 10*3/uL — ABNORMAL HIGH (ref 150–440)
RBC: 5.21 10*6/uL — ABNORMAL HIGH (ref 3.80–5.20)
RDW: 17.2 % — ABNORMAL HIGH (ref 11.5–14.5)
WBC: 10.1 10*3/uL (ref 3.6–11.0)

## 2012-05-23 LAB — COMPREHENSIVE METABOLIC PANEL
Albumin: 3.5 g/dL (ref 3.4–5.0)
Anion Gap: 10 (ref 7–16)
BUN: 25 mg/dL — ABNORMAL HIGH (ref 7–18)
Bilirubin,Total: 0.7 mg/dL (ref 0.2–1.0)
Co2: 39 mmol/L — ABNORMAL HIGH (ref 21–32)
EGFR (African American): 52 — ABNORMAL LOW
EGFR (Non-African Amer.): 45 — ABNORMAL LOW
Glucose: 155 mg/dL — ABNORMAL HIGH (ref 65–99)
Osmolality: 279 (ref 275–301)
Potassium: 2.6 mmol/L — ABNORMAL LOW (ref 3.5–5.1)
SGPT (ALT): 26 U/L (ref 12–78)
Sodium: 136 mmol/L (ref 136–145)
Total Protein: 7.7 g/dL (ref 6.4–8.2)

## 2012-05-23 LAB — PROTIME-INR: INR: 1

## 2012-05-23 LAB — URINALYSIS, COMPLETE
Bacteria: NONE SEEN
Bilirubin,UR: NEGATIVE
Glucose,UR: NEGATIVE mg/dL (ref 0–75)
Ketone: NEGATIVE
Leukocyte Esterase: NEGATIVE
Protein: NEGATIVE
RBC,UR: 6 /HPF (ref 0–5)
Specific Gravity: 1.06 (ref 1.003–1.030)
Squamous Epithelial: 1

## 2012-05-23 LAB — LIPASE, BLOOD: Lipase: 90 U/L (ref 73–393)

## 2012-05-24 LAB — CBC WITH DIFFERENTIAL/PLATELET
Basophil #: 0.1 10*3/uL (ref 0.0–0.1)
Basophil %: 0.8 %
Eosinophil %: 1 %
HGB: 10.5 g/dL — ABNORMAL LOW (ref 12.0–16.0)
Lymphocyte #: 0.9 10*3/uL — ABNORMAL LOW (ref 1.0–3.6)
Lymphocyte %: 13.5 %
MCHC: 33.7 g/dL (ref 32.0–36.0)
MCV: 76 fL — ABNORMAL LOW (ref 80–100)
Monocyte %: 12.3 %
Platelet: 357 10*3/uL (ref 150–440)
RBC: 4.12 10*6/uL (ref 3.80–5.20)
RDW: 17.8 % — ABNORMAL HIGH (ref 11.5–14.5)
WBC: 6.8 10*3/uL (ref 3.6–11.0)

## 2012-05-24 LAB — PROTIME-INR
INR: 1.1
Prothrombin Time: 14.7 secs (ref 11.5–14.7)

## 2012-05-24 LAB — TSH: Thyroid Stimulating Horm: 8.15 u[IU]/mL — ABNORMAL HIGH

## 2012-05-24 LAB — COMPREHENSIVE METABOLIC PANEL
Albumin: 2.5 g/dL — ABNORMAL LOW (ref 3.4–5.0)
Anion Gap: 5 — ABNORMAL LOW (ref 7–16)
BUN: 20 mg/dL — ABNORMAL HIGH (ref 7–18)
Calcium, Total: 7 mg/dL — CL (ref 8.5–10.1)
Chloride: 98 mmol/L (ref 98–107)
Co2: 38 mmol/L — ABNORMAL HIGH (ref 21–32)
Creatinine: 1.17 mg/dL (ref 0.60–1.30)
EGFR (Non-African Amer.): 46 — ABNORMAL LOW
Potassium: 3.4 mmol/L — ABNORMAL LOW (ref 3.5–5.1)
SGOT(AST): 19 U/L (ref 15–37)
SGPT (ALT): 18 U/L (ref 12–78)
Sodium: 141 mmol/L (ref 136–145)

## 2012-05-24 LAB — MAGNESIUM: Magnesium: 1.4 mg/dL — ABNORMAL LOW

## 2012-05-26 LAB — COMPREHENSIVE METABOLIC PANEL
Albumin: 2.7 g/dL — ABNORMAL LOW (ref 3.4–5.0)
Anion Gap: 6 — ABNORMAL LOW (ref 7–16)
BUN: 9 mg/dL (ref 7–18)
Bilirubin,Total: 0.3 mg/dL (ref 0.2–1.0)
Calcium, Total: 7.8 mg/dL — ABNORMAL LOW (ref 8.5–10.1)
Chloride: 102 mmol/L (ref 98–107)
Co2: 32 mmol/L (ref 21–32)
EGFR (African American): >60
EGFR (Non-African Amer.): >60
Glucose: 185 mg/dL — ABNORMAL HIGH (ref 65–99)
Osmolality: 283 (ref 275–301)
Potassium: 3.1 mmol/L — ABNORMAL LOW (ref 3.5–5.1)
SGOT(AST): 17 U/L (ref 15–37)
Sodium: 140 mmol/L (ref 136–145)
Total Protein: 5.9 g/dL — ABNORMAL LOW (ref 6.4–8.2)

## 2012-05-26 LAB — CBC WITH DIFFERENTIAL/PLATELET
HGB: 11.4 g/dL — ABNORMAL LOW (ref 12.0–16.0)
Lymphocytes: 4 %
MCH: 25.3 pg — ABNORMAL LOW (ref 26.0–34.0)
MCHC: 33.6 g/dL (ref 32.0–36.0)
Monocytes: 1 %
Myelocyte: 1 %
RDW: 17.4 % — ABNORMAL HIGH (ref 11.5–14.5)
Segmented Neutrophils: 91 %

## 2012-05-27 LAB — CBC WITH DIFFERENTIAL/PLATELET
Bands: 8 %
HCT: 31.1 % — ABNORMAL LOW (ref 35.0–47.0)
Lymphocytes: 3 %
MCHC: 32.3 g/dL (ref 32.0–36.0)
MCV: 77 fL — ABNORMAL LOW (ref 80–100)
Monocytes: 5 %
Platelet: 314 10*3/uL (ref 150–440)
RBC: 4.07 10*6/uL (ref 3.80–5.20)
Segmented Neutrophils: 84 %

## 2012-05-27 LAB — BASIC METABOLIC PANEL
Anion Gap: 3 — ABNORMAL LOW (ref 7–16)
BUN: 14 mg/dL (ref 7–18)
Calcium, Total: 7.3 mg/dL — ABNORMAL LOW (ref 8.5–10.1)
Chloride: 107 mmol/L (ref 98–107)
Creatinine: 0.83 mg/dL (ref 0.60–1.30)
EGFR (African American): >60
EGFR (Non-African Amer.): >60
Glucose: 88 mg/dL (ref 65–99)
Osmolality: 285 (ref 275–301)
Potassium: 3.5 mmol/L (ref 3.5–5.1)
Sodium: 143 mmol/L (ref 136–145)

## 2012-06-15 LAB — CBC CANCER CENTER
Basophil #: 0.1 x10 3/mm (ref 0.0–0.1)
Basophil %: 2 %
Eosinophil #: 0.1 x10 3/mm (ref 0.0–0.7)
Eosinophil %: 2 %
HCT: 32.2 % — ABNORMAL LOW (ref 35.0–47.0)
Lymphocyte %: 19.6 %
MCV: 78 fL — ABNORMAL LOW (ref 80–100)
Monocyte #: 0.5 x10 3/mm (ref 0.2–0.9)
Monocyte %: 12.6 %
Neutrophil #: 2.4 x10 3/mm (ref 1.4–6.5)
Neutrophil %: 63.8 %
WBC: 3.8 x10 3/mm (ref 3.6–11.0)

## 2012-06-15 LAB — COMPREHENSIVE METABOLIC PANEL
Albumin: 2.9 g/dL — ABNORMAL LOW (ref 3.4–5.0)
Alkaline Phosphatase: 135 U/L (ref 50–136)
BUN: 15 mg/dL (ref 7–18)
Bilirubin,Total: 0.5 mg/dL (ref 0.2–1.0)
Calcium, Total: 7.5 mg/dL — ABNORMAL LOW (ref 8.5–10.1)
Chloride: 103 mmol/L (ref 98–107)
EGFR (African American): >60
Osmolality: 284 (ref 275–301)
Potassium: 3.7 mmol/L (ref 3.5–5.1)
Sodium: 142 mmol/L (ref 136–145)

## 2012-06-17 ENCOUNTER — Ambulatory Visit: Payer: Self-pay | Admitting: Oncology

## 2012-06-22 LAB — CBC CANCER CENTER
Basophil #: 0.1 x10 3/mm (ref 0.0–0.1)
Basophil %: 2.2 %
Eosinophil %: 1.4 %
HCT: 35.3 % (ref 35.0–47.0)
Lymphocyte #: 0.9 x10 3/mm — ABNORMAL LOW (ref 1.0–3.6)
Lymphocyte %: 18.7 %
MCHC: 31.8 g/dL — ABNORMAL LOW (ref 32.0–36.0)
Monocyte #: 0.4 x10 3/mm (ref 0.2–0.9)
Monocyte %: 7.8 %
Neutrophil #: 3.5 x10 3/mm (ref 1.4–6.5)
Neutrophil %: 69.9 %
Platelet: 339 x10 3/mm (ref 150–440)
RBC: 4.5 10*6/uL (ref 3.80–5.20)
RDW: 20.9 % — ABNORMAL HIGH (ref 11.5–14.5)
WBC: 5 x10 3/mm (ref 3.6–11.0)

## 2012-06-29 LAB — CBC CANCER CENTER
Basophil #: 0.1 x10 3/mm (ref 0.0–0.1)
Eosinophil #: 0.1 x10 3/mm (ref 0.0–0.7)
Eosinophil %: 2 %
HGB: 10.9 g/dL — ABNORMAL LOW (ref 12.0–16.0)
Lymphocyte #: 0.6 x10 3/mm — ABNORMAL LOW (ref 1.0–3.6)
Lymphocyte %: 10.7 %
MCH: 25.7 pg — ABNORMAL LOW (ref 26.0–34.0)
MCV: 79 fL — ABNORMAL LOW (ref 80–100)
Monocyte #: 0.3 x10 3/mm (ref 0.2–0.9)
Neutrophil #: 4.6 x10 3/mm (ref 1.4–6.5)
Platelet: 234 x10 3/mm (ref 150–440)
RBC: 4.22 10*6/uL (ref 3.80–5.20)
WBC: 5.8 x10 3/mm (ref 3.6–11.0)

## 2012-06-30 LAB — CA 125: CA 125: 127.2 U/mL — ABNORMAL HIGH (ref 0.0–34.0)

## 2012-07-06 LAB — COMPREHENSIVE METABOLIC PANEL
Albumin: 3.1 g/dL — ABNORMAL LOW (ref 3.4–5.0)
Alkaline Phosphatase: 108 U/L (ref 50–136)
Anion Gap: 9 (ref 7–16)
BUN: 9 mg/dL (ref 7–18)
Chloride: 105 mmol/L (ref 98–107)
EGFR (Non-African Amer.): >60
Potassium: 3.3 mmol/L — ABNORMAL LOW (ref 3.5–5.1)
SGPT (ALT): 17 U/L (ref 12–78)
Sodium: 143 mmol/L (ref 136–145)

## 2012-07-06 LAB — CBC CANCER CENTER
Basophil #: 0.1 x10 3/mm (ref 0.0–0.1)
Eosinophil %: 3.7 %
HGB: 11.3 g/dL — ABNORMAL LOW (ref 12.0–16.0)
Lymphocyte #: 0.8 x10 3/mm — ABNORMAL LOW (ref 1.0–3.6)
Lymphocyte %: 22.8 %
Monocyte #: 0.5 x10 3/mm (ref 0.2–0.9)
Monocyte %: 14 %
Neutrophil %: 56.6 %
Platelet: 271 x10 3/mm (ref 150–440)
RBC: 4.22 10*6/uL (ref 3.80–5.20)
RDW: 22 % — ABNORMAL HIGH (ref 11.5–14.5)
WBC: 3.5 x10 3/mm — ABNORMAL LOW (ref 3.6–11.0)

## 2012-07-17 ENCOUNTER — Ambulatory Visit: Payer: Self-pay | Admitting: Oncology

## 2012-07-25 ENCOUNTER — Ambulatory Visit: Payer: Self-pay | Admitting: Family Medicine

## 2012-07-27 ENCOUNTER — Ambulatory Visit: Payer: Self-pay | Admitting: Oncology

## 2012-07-27 LAB — COMPREHENSIVE METABOLIC PANEL
Albumin: 3.3 g/dL — ABNORMAL LOW (ref 3.4–5.0)
Alkaline Phosphatase: 99 U/L (ref 50–136)
Anion Gap: 10 (ref 7–16)
Bilirubin,Total: 0.6 mg/dL (ref 0.2–1.0)
Co2: 29 mmol/L (ref 21–32)
Creatinine: 0.96 mg/dL (ref 0.60–1.30)
EGFR (African American): >60
EGFR (Non-African Amer.): 58 — ABNORMAL LOW
Glucose: 102 mg/dL — ABNORMAL HIGH (ref 65–99)
Osmolality: 288 (ref 275–301)
Potassium: 3.4 mmol/L — ABNORMAL LOW (ref 3.5–5.1)
SGOT(AST): 17 U/L (ref 15–37)
SGPT (ALT): 16 U/L (ref 12–78)
Sodium: 145 mmol/L (ref 136–145)
Total Protein: 6.3 g/dL — ABNORMAL LOW (ref 6.4–8.2)

## 2012-07-27 LAB — CBC CANCER CENTER
Eosinophil #: 0 x10 3/mm (ref 0.0–0.7)
Eosinophil %: 1.2 %
HCT: 33.5 % — ABNORMAL LOW (ref 35.0–47.0)
Lymphocyte %: 25.5 %
MCH: 27.5 pg (ref 26.0–34.0)
MCV: 81 fL (ref 80–100)
Monocyte %: 18.4 %
Platelet: 270 x10 3/mm (ref 150–440)
RDW: 22 % — ABNORMAL HIGH (ref 11.5–14.5)
WBC: 2.7 x10 3/mm — ABNORMAL LOW (ref 3.6–11.0)

## 2012-07-28 LAB — CA 125: CA 125: 76.6 U/mL — ABNORMAL HIGH (ref 0.0–34.0)

## 2012-08-17 ENCOUNTER — Ambulatory Visit: Payer: Self-pay | Admitting: Oncology

## 2012-08-17 LAB — COMPREHENSIVE METABOLIC PANEL
Albumin: 3.2 g/dL — ABNORMAL LOW (ref 3.4–5.0)
Anion Gap: 10 (ref 7–16)
BUN: 16 mg/dL (ref 7–18)
Bilirubin,Total: 0.6 mg/dL (ref 0.2–1.0)
Calcium, Total: 7.8 mg/dL — ABNORMAL LOW (ref 8.5–10.1)
Chloride: 107 mmol/L (ref 98–107)
Co2: 29 mmol/L (ref 21–32)
Creatinine: 0.89 mg/dL (ref 0.60–1.30)
EGFR (African American): >60
EGFR (Non-African Amer.): >60
Glucose: 88 mg/dL (ref 65–99)
Osmolality: 291 (ref 275–301)
Potassium: 3.3 mmol/L — ABNORMAL LOW (ref 3.5–5.1)
SGOT(AST): 14 U/L — ABNORMAL LOW (ref 15–37)
SGPT (ALT): 23 U/L (ref 12–78)
Sodium: 146 mmol/L — ABNORMAL HIGH (ref 136–145)

## 2012-08-17 LAB — CBC CANCER CENTER
Basophil %: 1 %
HGB: 12.1 g/dL (ref 12.0–16.0)
Lymphocyte #: 1.1 x10 3/mm (ref 1.0–3.6)
MCHC: 33.7 g/dL (ref 32.0–36.0)
MCV: 84 fL (ref 80–100)
Monocyte #: 0.8 x10 3/mm (ref 0.2–0.9)
Neutrophil %: 65.5 %
RDW: 20.7 % — ABNORMAL HIGH (ref 11.5–14.5)

## 2012-09-07 LAB — CBC CANCER CENTER
Basophil #: 0 x10 3/mm (ref 0.0–0.1)
Eosinophil %: 0.8 %
HGB: 12 g/dL (ref 12.0–16.0)
Lymphocyte #: 1.2 x10 3/mm (ref 1.0–3.6)
Lymphocyte %: 22.9 %
MCHC: 33.7 g/dL (ref 32.0–36.0)
Monocyte #: 0.7 x10 3/mm (ref 0.2–0.9)
Monocyte %: 12.6 %
Neutrophil %: 62.8 %
Platelet: 192 x10 3/mm (ref 150–440)
RBC: 4.22 10*6/uL (ref 3.80–5.20)

## 2012-09-07 LAB — COMPREHENSIVE METABOLIC PANEL
Alkaline Phosphatase: 77 U/L (ref 50–136)
Bilirubin,Total: 0.6 mg/dL (ref 0.2–1.0)
Calcium, Total: 8.4 mg/dL — ABNORMAL LOW (ref 8.5–10.1)
Chloride: 105 mmol/L (ref 98–107)
Co2: 29 mmol/L (ref 21–32)
EGFR (African American): >60
SGOT(AST): 15 U/L (ref 15–37)
SGPT (ALT): 19 U/L (ref 12–78)
Total Protein: 5.8 g/dL — ABNORMAL LOW (ref 6.4–8.2)

## 2012-09-08 LAB — CA 125: CA 125: 57.3 U/mL — ABNORMAL HIGH (ref 0.0–34.0)

## 2012-09-17 ENCOUNTER — Ambulatory Visit: Payer: Self-pay | Admitting: Oncology

## 2012-09-28 LAB — CBC CANCER CENTER
Basophil #: 0 x10 3/mm (ref 0.0–0.1)
Basophil %: 0.6 %
Eosinophil #: 0 x10 3/mm (ref 0.0–0.7)
Eosinophil %: 0.1 %
HGB: 13.1 g/dL (ref 12.0–16.0)
Lymphocyte #: 0.9 x10 3/mm — ABNORMAL LOW (ref 1.0–3.6)
MCHC: 33.1 g/dL (ref 32.0–36.0)
MCV: 85 fL (ref 80–100)
Monocyte #: 0.7 x10 3/mm (ref 0.2–0.9)
Neutrophil #: 4.5 x10 3/mm (ref 1.4–6.5)
Neutrophil %: 73.3 %
Platelet: 278 x10 3/mm (ref 150–440)
RDW: 19.6 % — ABNORMAL HIGH (ref 11.5–14.5)
WBC: 6.1 x10 3/mm (ref 3.6–11.0)

## 2012-09-28 LAB — COMPREHENSIVE METABOLIC PANEL
Albumin: 3.3 g/dL — ABNORMAL LOW (ref 3.4–5.0)
Calcium, Total: 8.1 mg/dL — ABNORMAL LOW (ref 8.5–10.1)
Chloride: 105 mmol/L (ref 98–107)
Co2: 28 mmol/L (ref 21–32)
Creatinine: 1.16 mg/dL (ref 0.60–1.30)
Glucose: 101 mg/dL — ABNORMAL HIGH (ref 65–99)
Osmolality: 287 (ref 275–301)
Potassium: 3.7 mmol/L (ref 3.5–5.1)
SGOT(AST): 18 U/L (ref 15–37)
SGPT (ALT): 33 U/L (ref 12–78)
Sodium: 144 mmol/L (ref 136–145)

## 2012-10-17 ENCOUNTER — Ambulatory Visit: Payer: Self-pay | Admitting: Oncology

## 2012-10-26 LAB — COMPREHENSIVE METABOLIC PANEL
Albumin: 3.1 g/dL — ABNORMAL LOW (ref 3.4–5.0)
Anion Gap: 9 (ref 7–16)
BUN: 17 mg/dL (ref 7–18)
Bilirubin,Total: 0.6 mg/dL (ref 0.2–1.0)
Chloride: 105 mmol/L (ref 98–107)
Co2: 29 mmol/L (ref 21–32)
EGFR (African American): 54 — ABNORMAL LOW
EGFR (Non-African Amer.): 46 — ABNORMAL LOW
Osmolality: 286 (ref 275–301)
Potassium: 2.7 mmol/L — ABNORMAL LOW (ref 3.5–5.1)
Sodium: 143 mmol/L (ref 136–145)
Total Protein: 5.9 g/dL — ABNORMAL LOW (ref 6.4–8.2)

## 2012-10-26 LAB — CBC CANCER CENTER
Basophil #: 0.1 x10 3/mm (ref 0.0–0.1)
Basophil %: 1.1 %
Eosinophil #: 0 x10 3/mm (ref 0.0–0.7)
Eosinophil %: 0.4 %
HCT: 39.3 % (ref 35.0–47.0)
HGB: 13.4 g/dL (ref 12.0–16.0)
Lymphocyte #: 1.6 x10 3/mm (ref 1.0–3.6)
MCH: 28.9 pg (ref 26.0–34.0)
MCHC: 33.9 g/dL (ref 32.0–36.0)
Monocyte #: 1.3 x10 3/mm — ABNORMAL HIGH (ref 0.2–0.9)
Monocyte %: 15.1 %
Neutrophil #: 5.8 x10 3/mm (ref 1.4–6.5)
Neutrophil %: 65.5 %
Platelet: 248 x10 3/mm (ref 150–440)
RBC: 4.61 10*6/uL (ref 3.80–5.20)
RDW: 18.6 % — ABNORMAL HIGH (ref 11.5–14.5)
WBC: 8.9 x10 3/mm (ref 3.6–11.0)

## 2012-10-26 LAB — MAGNESIUM: Magnesium: 1.7 mg/dL — ABNORMAL LOW

## 2012-10-27 LAB — CA 125: CA 125: 70.3 U/mL — ABNORMAL HIGH

## 2012-11-02 LAB — CBC CANCER CENTER
Basophil #: 0.1 x10 3/mm (ref 0.0–0.1)
Basophil %: 0.7 %
Eosinophil #: 0 x10 3/mm (ref 0.0–0.7)
HCT: 40.7 % (ref 35.0–47.0)
HGB: 13.4 g/dL (ref 12.0–16.0)
Lymphocyte #: 0.6 x10 3/mm — ABNORMAL LOW (ref 1.0–3.6)
Lymphocyte %: 5.2 %
MCH: 28.5 pg (ref 26.0–34.0)
MCV: 87 fL (ref 80–100)
Monocyte %: 4.7 %
Neutrophil #: 11 x10 3/mm — ABNORMAL HIGH (ref 1.4–6.5)
Neutrophil %: 89.1 %
Platelet: 201 x10 3/mm (ref 150–440)
RDW: 18.8 % — ABNORMAL HIGH (ref 11.5–14.5)

## 2012-11-02 LAB — COMPREHENSIVE METABOLIC PANEL
Albumin: 3.2 g/dL — ABNORMAL LOW (ref 3.4–5.0)
Alkaline Phosphatase: 99 U/L (ref 50–136)
Anion Gap: 11 (ref 7–16)
BUN: 19 mg/dL — ABNORMAL HIGH (ref 7–18)
Calcium, Total: 8 mg/dL — ABNORMAL LOW (ref 8.5–10.1)
Chloride: 106 mmol/L (ref 98–107)
Creatinine: 1.16 mg/dL (ref 0.60–1.30)
EGFR (African American): 54 — ABNORMAL LOW
EGFR (Non-African Amer.): 46 — ABNORMAL LOW
Glucose: 122 mg/dL — ABNORMAL HIGH (ref 65–99)
Potassium: 4.1 mmol/L (ref 3.5–5.1)
Sodium: 143 mmol/L (ref 136–145)
Total Protein: 6.1 g/dL — ABNORMAL LOW (ref 6.4–8.2)

## 2012-11-16 LAB — CBC CANCER CENTER
Eosinophil #: 0 x10 3/mm (ref 0.0–0.7)
HCT: 40.4 % (ref 35.0–47.0)
Lymphocyte %: 8.1 %
MCH: 28.6 pg (ref 26.0–34.0)
MCHC: 33.3 g/dL (ref 32.0–36.0)
Monocyte #: 1.1 x10 3/mm — ABNORMAL HIGH (ref 0.2–0.9)
Neutrophil %: 82.9 %
Platelet: 239 x10 3/mm (ref 150–440)
RBC: 4.7 10*6/uL (ref 3.80–5.20)
RDW: 17 % — ABNORMAL HIGH (ref 11.5–14.5)
WBC: 12.3 x10 3/mm — ABNORMAL HIGH (ref 3.6–11.0)

## 2012-11-16 LAB — COMPREHENSIVE METABOLIC PANEL
Albumin: 3.4 g/dL (ref 3.4–5.0)
BUN: 24 mg/dL — ABNORMAL HIGH (ref 7–18)
Bilirubin,Total: 0.5 mg/dL (ref 0.2–1.0)
Chloride: 104 mmol/L (ref 98–107)
Creatinine: 1.21 mg/dL (ref 0.60–1.30)
EGFR (African American): 51 — ABNORMAL LOW
EGFR (Non-African Amer.): 44 — ABNORMAL LOW
Osmolality: 285 (ref 275–301)
Potassium: 4.2 mmol/L (ref 3.5–5.1)
SGPT (ALT): 42 U/L (ref 12–78)

## 2012-11-17 ENCOUNTER — Ambulatory Visit: Payer: Self-pay | Admitting: Oncology

## 2012-11-17 LAB — CA 125: CA 125: 70.1 U/mL — ABNORMAL HIGH (ref 0.0–34.0)

## 2012-12-07 LAB — CBC CANCER CENTER
Basophil #: 0.1 x10 3/mm (ref 0.0–0.1)
Eosinophil #: 0.1 x10 3/mm (ref 0.0–0.7)
HCT: 37 % (ref 35.0–47.0)
Lymphocyte #: 0.9 x10 3/mm — ABNORMAL LOW (ref 1.0–3.6)
Lymphocyte %: 17.5 %
MCHC: 33.4 g/dL (ref 32.0–36.0)
MCV: 84 fL (ref 80–100)
Monocyte %: 12.1 %
Platelet: 250 x10 3/mm (ref 150–440)
RBC: 4.38 10*6/uL (ref 3.80–5.20)
RDW: 16.2 % — ABNORMAL HIGH (ref 11.5–14.5)
WBC: 5 x10 3/mm (ref 3.6–11.0)

## 2012-12-07 LAB — COMPREHENSIVE METABOLIC PANEL
Albumin: 2.6 g/dL — ABNORMAL LOW (ref 3.4–5.0)
Alkaline Phosphatase: 104 U/L (ref 50–136)
Anion Gap: 8 (ref 7–16)
BUN: 9 mg/dL (ref 7–18)
Calcium, Total: 8 mg/dL — ABNORMAL LOW (ref 8.5–10.1)
Chloride: 104 mmol/L (ref 98–107)
Co2: 29 mmol/L (ref 21–32)
Creatinine: 1.09 mg/dL (ref 0.60–1.30)
EGFR (Non-African Amer.): 50 — ABNORMAL LOW
Glucose: 101 mg/dL — ABNORMAL HIGH (ref 65–99)
Potassium: 3.3 mmol/L — ABNORMAL LOW (ref 3.5–5.1)
SGOT(AST): 24 U/L (ref 15–37)
Sodium: 141 mmol/L (ref 136–145)

## 2012-12-08 LAB — CA 125: CA 125: 58.9 U/mL — ABNORMAL HIGH (ref 0.0–34.0)

## 2012-12-17 ENCOUNTER — Ambulatory Visit: Payer: Self-pay | Admitting: Oncology

## 2012-12-18 LAB — CBC CANCER CENTER
Basophil #: 0.1 x10 3/mm (ref 0.0–0.1)
Basophil %: 0.6 %
Eosinophil #: 0.1 x10 3/mm (ref 0.0–0.7)
HCT: 38.8 % (ref 35.0–47.0)
HGB: 12.8 g/dL (ref 12.0–16.0)
Lymphocyte %: 12.4 %
MCH: 27.5 pg (ref 26.0–34.0)
MCV: 83 fL (ref 80–100)
Monocyte #: 1 x10 3/mm — ABNORMAL HIGH (ref 0.2–0.9)
Monocyte %: 9 %
Neutrophil #: 8.6 x10 3/mm — ABNORMAL HIGH (ref 1.4–6.5)
Neutrophil %: 77.2 %
Platelet: 271 x10 3/mm (ref 150–440)
RDW: 15.4 % — ABNORMAL HIGH (ref 11.5–14.5)

## 2012-12-18 LAB — COMPREHENSIVE METABOLIC PANEL
Albumin: 2.9 g/dL — ABNORMAL LOW (ref 3.4–5.0)
Alkaline Phosphatase: 119 U/L — ABNORMAL HIGH
Anion Gap: 5 — ABNORMAL LOW (ref 7–16)
BUN: 22 mg/dL — ABNORMAL HIGH (ref 7–18)
Calcium, Total: 8 mg/dL — ABNORMAL LOW (ref 8.5–10.1)
Co2: 32 mmol/L (ref 21–32)
Creatinine: 1.05 mg/dL (ref 0.60–1.30)
SGOT(AST): 29 U/L (ref 15–37)
Total Protein: 6 g/dL — ABNORMAL LOW (ref 6.4–8.2)

## 2012-12-21 LAB — CBC CANCER CENTER
Basophil %: 1.2 %
HCT: 39.9 % (ref 35.0–47.0)
HGB: 13.1 g/dL (ref 12.0–16.0)
Lymphocyte %: 18.1 %
MCV: 82 fL (ref 80–100)
Monocyte #: 1.1 x10 3/mm — ABNORMAL HIGH (ref 0.2–0.9)
Neutrophil %: 66.5 %
Platelet: 273 x10 3/mm (ref 150–440)
RDW: 15.8 % — ABNORMAL HIGH (ref 11.5–14.5)
WBC: 8.8 x10 3/mm (ref 3.6–11.0)

## 2012-12-28 LAB — COMPREHENSIVE METABOLIC PANEL
Alkaline Phosphatase: 112 U/L
Anion Gap: 9 (ref 7–16)
BUN: 20 mg/dL — ABNORMAL HIGH (ref 7–18)
Bilirubin,Total: 0.4 mg/dL (ref 0.2–1.0)
Calcium, Total: 7.7 mg/dL — ABNORMAL LOW (ref 8.5–10.1)
Creatinine: 1.06 mg/dL (ref 0.60–1.30)
EGFR (African American): 60 — ABNORMAL LOW
Glucose: 86 mg/dL (ref 65–99)
Potassium: 3 mmol/L — ABNORMAL LOW (ref 3.5–5.1)
SGPT (ALT): 44 U/L (ref 12–78)
Sodium: 145 mmol/L (ref 136–145)

## 2012-12-28 LAB — CBC CANCER CENTER
Basophil #: 0 x10 3/mm (ref 0.0–0.1)
Basophil %: 0.3 %
Eosinophil #: 0.1 x10 3/mm (ref 0.0–0.7)
Eosinophil %: 1.2 %
Lymphocyte %: 37.2 %
MCH: 27.2 pg (ref 26.0–34.0)
MCHC: 32.9 g/dL (ref 32.0–36.0)
MCV: 83 fL (ref 80–100)
Monocyte #: 0.7 x10 3/mm (ref 0.2–0.9)
Monocyte %: 14.3 %
Neutrophil #: 2.3 x10 3/mm (ref 1.4–6.5)
Neutrophil %: 47 %
RDW: 15.8 % — ABNORMAL HIGH (ref 11.5–14.5)
WBC: 4.9 x10 3/mm (ref 3.6–11.0)

## 2013-01-04 LAB — COMPREHENSIVE METABOLIC PANEL
Anion Gap: 9 (ref 7–16)
BUN: 29 mg/dL — ABNORMAL HIGH (ref 7–18)
Bilirubin,Total: 0.5 mg/dL (ref 0.2–1.0)
Calcium, Total: 7.6 mg/dL — ABNORMAL LOW (ref 8.5–10.1)
Co2: 30 mmol/L (ref 21–32)
Creatinine: 1.99 mg/dL — ABNORMAL HIGH (ref 0.60–1.30)
EGFR (African American): 28 — ABNORMAL LOW
EGFR (Non-African Amer.): 24 — ABNORMAL LOW
Osmolality: 283 (ref 275–301)
Potassium: 2.9 mmol/L — ABNORMAL LOW (ref 3.5–5.1)
SGOT(AST): 28 U/L (ref 15–37)
SGPT (ALT): 66 U/L (ref 12–78)
Total Protein: 6.3 g/dL — ABNORMAL LOW (ref 6.4–8.2)

## 2013-01-04 LAB — CBC CANCER CENTER
Eosinophil #: 0 x10 3/mm (ref 0.0–0.7)
Lymphocyte #: 0.9 x10 3/mm — ABNORMAL LOW (ref 1.0–3.6)
MCH: 27 pg (ref 26.0–34.0)
MCV: 80 fL (ref 80–100)
Monocyte #: 0.3 x10 3/mm (ref 0.2–0.9)
Monocyte %: 5.6 %
Neutrophil #: 3.7 x10 3/mm (ref 1.4–6.5)
Platelet: 66 x10 3/mm — ABNORMAL LOW (ref 150–440)
RBC: 4.37 10*6/uL (ref 3.80–5.20)
WBC: 4.9 x10 3/mm (ref 3.6–11.0)

## 2013-01-07 LAB — CA 125: CA 125: 75.8 U/mL — ABNORMAL HIGH (ref 0.0–34.0)

## 2013-01-17 ENCOUNTER — Ambulatory Visit: Payer: Self-pay | Admitting: Oncology

## 2013-01-25 LAB — COMPREHENSIVE METABOLIC PANEL
ANION GAP: 9 (ref 7–16)
AST: 21 U/L (ref 15–37)
Albumin: 2.8 g/dL — ABNORMAL LOW (ref 3.4–5.0)
Alkaline Phosphatase: 83 U/L
BILIRUBIN TOTAL: 0.7 mg/dL (ref 0.2–1.0)
BUN: 12 mg/dL (ref 7–18)
Calcium, Total: 7.8 mg/dL — ABNORMAL LOW (ref 8.5–10.1)
Chloride: 101 mmol/L (ref 98–107)
Co2: 31 mmol/L (ref 21–32)
Creatinine: 2.05 mg/dL — ABNORMAL HIGH (ref 0.60–1.30)
GFR CALC AF AMER: 27 — AB
GFR CALC NON AF AMER: 23 — AB
Glucose: 109 mg/dL — ABNORMAL HIGH (ref 65–99)
Osmolality: 282 (ref 275–301)
POTASSIUM: 3.1 mmol/L — AB (ref 3.5–5.1)
SGPT (ALT): 17 U/L (ref 12–78)
SODIUM: 141 mmol/L (ref 136–145)
Total Protein: 6 g/dL — ABNORMAL LOW (ref 6.4–8.2)

## 2013-01-25 LAB — CBC CANCER CENTER
Basophil #: 0.2 x10 3/mm — ABNORMAL HIGH (ref 0.0–0.1)
Basophil %: 3 %
Eosinophil #: 0 x10 3/mm (ref 0.0–0.7)
Eosinophil %: 0.6 %
HCT: 35.2 % (ref 35.0–47.0)
HGB: 11.7 g/dL — AB (ref 12.0–16.0)
Lymphocyte #: 1.1 x10 3/mm (ref 1.0–3.6)
Lymphocyte %: 19.9 %
MCH: 27.1 pg (ref 26.0–34.0)
MCHC: 33.3 g/dL (ref 32.0–36.0)
MCV: 81 fL (ref 80–100)
MONOS PCT: 15.8 %
Monocyte #: 0.9 x10 3/mm (ref 0.2–0.9)
Neutrophil #: 3.5 x10 3/mm (ref 1.4–6.5)
Neutrophil %: 60.7 %
PLATELETS: 287 x10 3/mm (ref 150–440)
RBC: 4.33 10*6/uL (ref 3.80–5.20)
RDW: 18.4 % — ABNORMAL HIGH (ref 11.5–14.5)
WBC: 5.7 x10 3/mm (ref 3.6–11.0)

## 2013-02-01 LAB — CBC CANCER CENTER
BASOS PCT: 1.1 %
Basophil #: 0.1 x10 3/mm (ref 0.0–0.1)
EOS ABS: 0.1 x10 3/mm (ref 0.0–0.7)
EOS PCT: 1.1 %
HCT: 34.3 % — ABNORMAL LOW (ref 35.0–47.0)
HGB: 11.1 g/dL — ABNORMAL LOW (ref 12.0–16.0)
LYMPHS PCT: 17.1 %
Lymphocyte #: 1.2 x10 3/mm (ref 1.0–3.6)
MCH: 26.6 pg (ref 26.0–34.0)
MCHC: 32.5 g/dL (ref 32.0–36.0)
MCV: 82 fL (ref 80–100)
MONO ABS: 0.8 x10 3/mm (ref 0.2–0.9)
Monocyte %: 11.4 %
Neutrophil #: 5.1 x10 3/mm (ref 1.4–6.5)
Neutrophil %: 69.3 %
Platelet: 196 x10 3/mm (ref 150–440)
RBC: 4.18 10*6/uL (ref 3.80–5.20)
RDW: 19 % — AB (ref 11.5–14.5)
WBC: 7.3 x10 3/mm (ref 3.6–11.0)

## 2013-02-01 LAB — COMPREHENSIVE METABOLIC PANEL
Albumin: 2.7 g/dL — ABNORMAL LOW (ref 3.4–5.0)
Alkaline Phosphatase: 98 U/L
Anion Gap: 11 (ref 7–16)
BUN: 18 mg/dL (ref 7–18)
Bilirubin,Total: 0.6 mg/dL (ref 0.2–1.0)
CHLORIDE: 102 mmol/L (ref 98–107)
CREATININE: 1.66 mg/dL — AB (ref 0.60–1.30)
Calcium, Total: 7.9 mg/dL — ABNORMAL LOW (ref 8.5–10.1)
Co2: 28 mmol/L (ref 21–32)
EGFR (Non-African Amer.): 30 — ABNORMAL LOW
GFR CALC AF AMER: 35 — AB
Glucose: 100 mg/dL — ABNORMAL HIGH (ref 65–99)
Osmolality: 283 (ref 275–301)
POTASSIUM: 2.9 mmol/L — AB (ref 3.5–5.1)
SGOT(AST): 34 U/L (ref 15–37)
SGPT (ALT): 21 U/L (ref 12–78)
Sodium: 141 mmol/L (ref 136–145)
Total Protein: 5.9 g/dL — ABNORMAL LOW (ref 6.4–8.2)

## 2013-02-17 ENCOUNTER — Ambulatory Visit: Payer: Self-pay | Admitting: Oncology

## 2013-02-22 ENCOUNTER — Emergency Department: Payer: Self-pay | Admitting: Emergency Medicine

## 2013-02-22 LAB — CBC WITH DIFFERENTIAL/PLATELET
Basophil #: 0.1 10*3/uL (ref 0.0–0.1)
Basophil %: 0.9 %
EOS ABS: 0.1 10*3/uL (ref 0.0–0.7)
EOS PCT: 1.2 %
HCT: 37.3 % (ref 35.0–47.0)
HGB: 12.6 g/dL (ref 12.0–16.0)
LYMPHS ABS: 1.1 10*3/uL (ref 1.0–3.6)
LYMPHS PCT: 14.8 %
MCH: 28.3 pg (ref 26.0–34.0)
MCHC: 33.8 g/dL (ref 32.0–36.0)
MCV: 84 fL (ref 80–100)
Monocyte #: 0.7 x10 3/mm (ref 0.2–0.9)
Monocyte %: 9.9 %
NEUTROS ABS: 5.5 10*3/uL (ref 1.4–6.5)
Neutrophil %: 73.2 %
Platelet: 157 10*3/uL (ref 150–440)
RBC: 4.46 10*6/uL (ref 3.80–5.20)
RDW: 19.8 % — ABNORMAL HIGH (ref 11.5–14.5)
WBC: 7.4 10*3/uL (ref 3.6–11.0)

## 2013-02-22 LAB — COMPREHENSIVE METABOLIC PANEL
Albumin: 2.9 g/dL — ABNORMAL LOW (ref 3.4–5.0)
Alkaline Phosphatase: 182 U/L — ABNORMAL HIGH
Anion Gap: 8 (ref 7–16)
BUN: 15 mg/dL (ref 7–18)
Bilirubin,Total: 1 mg/dL (ref 0.2–1.0)
CALCIUM: 8 mg/dL — AB (ref 8.5–10.1)
CHLORIDE: 105 mmol/L (ref 98–107)
CREATININE: 1.4 mg/dL — AB (ref 0.60–1.30)
Co2: 29 mmol/L (ref 21–32)
EGFR (African American): 43 — ABNORMAL LOW
EGFR (Non-African Amer.): 37 — ABNORMAL LOW
Glucose: 108 mg/dL — ABNORMAL HIGH (ref 65–99)
Osmolality: 284 (ref 275–301)
Potassium: 2.7 mmol/L — ABNORMAL LOW (ref 3.5–5.1)
SGOT(AST): 88 U/L — ABNORMAL HIGH (ref 15–37)
SGPT (ALT): 67 U/L (ref 12–78)
Sodium: 142 mmol/L (ref 136–145)
TOTAL PROTEIN: 6 g/dL — AB (ref 6.4–8.2)

## 2013-02-22 LAB — URINALYSIS, COMPLETE
Bilirubin,UR: NEGATIVE
Glucose,UR: NEGATIVE mg/dL (ref 0–75)
Ketone: NEGATIVE
Leukocyte Esterase: NEGATIVE
NITRITE: NEGATIVE
PROTEIN: NEGATIVE
Ph: 6 (ref 4.5–8.0)
Specific Gravity: 1.025 (ref 1.003–1.030)

## 2013-02-22 LAB — LIPASE, BLOOD: LIPASE: 92 U/L (ref 73–393)

## 2013-03-08 ENCOUNTER — Ambulatory Visit: Payer: Self-pay | Admitting: Oncology

## 2013-03-08 LAB — CBC CANCER CENTER
BASOS ABS: 0 x10 3/mm (ref 0.0–0.1)
Basophil %: 0.7 %
EOS ABS: 0.1 x10 3/mm (ref 0.0–0.7)
Eosinophil %: 1.1 %
HCT: 38.7 % (ref 35.0–47.0)
HGB: 12.9 g/dL (ref 12.0–16.0)
LYMPHS ABS: 1.7 x10 3/mm (ref 1.0–3.6)
Lymphocyte %: 24.2 %
MCH: 28.2 pg (ref 26.0–34.0)
MCHC: 33.4 g/dL (ref 32.0–36.0)
MCV: 85 fL (ref 80–100)
MONOS PCT: 8.5 %
Monocyte #: 0.6 x10 3/mm (ref 0.2–0.9)
NEUTROS ABS: 4.5 x10 3/mm (ref 1.4–6.5)
Neutrophil %: 65.5 %
PLATELETS: 197 x10 3/mm (ref 150–440)
RBC: 4.57 10*6/uL (ref 3.80–5.20)
RDW: 19.2 % — ABNORMAL HIGH (ref 11.5–14.5)
WBC: 6.9 x10 3/mm (ref 3.6–11.0)

## 2013-03-08 LAB — COMPREHENSIVE METABOLIC PANEL
AST: 37 U/L (ref 15–37)
Albumin: 2.9 g/dL — ABNORMAL LOW (ref 3.4–5.0)
Alkaline Phosphatase: 145 U/L — ABNORMAL HIGH
Anion Gap: 9 (ref 7–16)
BUN: 19 mg/dL — ABNORMAL HIGH (ref 7–18)
Bilirubin,Total: 1 mg/dL (ref 0.2–1.0)
Calcium, Total: 7.8 mg/dL — ABNORMAL LOW (ref 8.5–10.1)
Chloride: 103 mmol/L (ref 98–107)
Co2: 31 mmol/L (ref 21–32)
Creatinine: 1.84 mg/dL — ABNORMAL HIGH (ref 0.60–1.30)
EGFR (African American): 31 — ABNORMAL LOW
EGFR (Non-African Amer.): 27 — ABNORMAL LOW
GLUCOSE: 91 mg/dL (ref 65–99)
Osmolality: 287 (ref 275–301)
Potassium: 2.8 mmol/L — ABNORMAL LOW (ref 3.5–5.1)
SGPT (ALT): 39 U/L (ref 12–78)
SODIUM: 143 mmol/L (ref 136–145)
TOTAL PROTEIN: 5.8 g/dL — AB (ref 6.4–8.2)

## 2013-03-09 LAB — CA 125: CA 125: 52.9 U/mL — ABNORMAL HIGH (ref 0.0–34.0)

## 2013-03-17 ENCOUNTER — Ambulatory Visit: Payer: Self-pay | Admitting: Oncology

## 2013-04-07 ENCOUNTER — Emergency Department: Payer: Self-pay | Admitting: Emergency Medicine

## 2013-04-07 LAB — COMPREHENSIVE METABOLIC PANEL
ALT: 44 U/L (ref 12–78)
ANION GAP: 10 (ref 7–16)
Albumin: 2.7 g/dL — ABNORMAL LOW (ref 3.4–5.0)
Alkaline Phosphatase: 464 U/L — ABNORMAL HIGH
BUN: 44 mg/dL — ABNORMAL HIGH (ref 7–18)
Bilirubin,Total: 2.7 mg/dL — ABNORMAL HIGH (ref 0.2–1.0)
CALCIUM: 7.3 mg/dL — AB (ref 8.5–10.1)
CO2: 30 mmol/L (ref 21–32)
CREATININE: 3.13 mg/dL — AB (ref 0.60–1.30)
Chloride: 92 mmol/L — ABNORMAL LOW (ref 98–107)
EGFR (African American): 16 — ABNORMAL LOW
GFR CALC NON AF AMER: 14 — AB
Glucose: 85 mg/dL (ref 65–99)
Osmolality: 275 (ref 275–301)
Potassium: 2.8 mmol/L — ABNORMAL LOW (ref 3.5–5.1)
SGOT(AST): 60 U/L — ABNORMAL HIGH (ref 15–37)
Sodium: 132 mmol/L — ABNORMAL LOW (ref 136–145)
TOTAL PROTEIN: 6.2 g/dL — AB (ref 6.4–8.2)

## 2013-04-07 LAB — CBC
HCT: 38.1 % (ref 35.0–47.0)
HGB: 13.2 g/dL (ref 12.0–16.0)
MCH: 30.2 pg (ref 26.0–34.0)
MCHC: 34.6 g/dL (ref 32.0–36.0)
MCV: 87 fL (ref 80–100)
PLATELETS: 226 10*3/uL (ref 150–440)
RBC: 4.36 10*6/uL (ref 3.80–5.20)
RDW: 18.4 % — AB (ref 11.5–14.5)
WBC: 7.2 10*3/uL (ref 3.6–11.0)

## 2013-04-07 LAB — URINALYSIS, COMPLETE
Bilirubin,UR: NEGATIVE
Glucose,UR: NEGATIVE mg/dL (ref 0–75)
Hyaline Cast: 3
Ketone: NEGATIVE
Nitrite: NEGATIVE
PH: 5 (ref 4.5–8.0)
Protein: 30
SPECIFIC GRAVITY: 1.014 (ref 1.003–1.030)
Squamous Epithelial: 1
WBC UR: 9 /HPF (ref 0–5)

## 2013-04-07 LAB — LIPASE, BLOOD: LIPASE: 104 U/L (ref 73–393)

## 2013-04-07 LAB — TROPONIN I: Troponin-I: <0.02 ng/mL

## 2013-05-17 DEATH — deceased

## 2014-04-14 IMAGING — CR DG CHEST 1V PORT
1 series · 1 of 1 positions shown · non-contrast
Comparison: none

REASON FOR EXAM: post thoracentesis  rm 12
COMMENTS:

[ap]
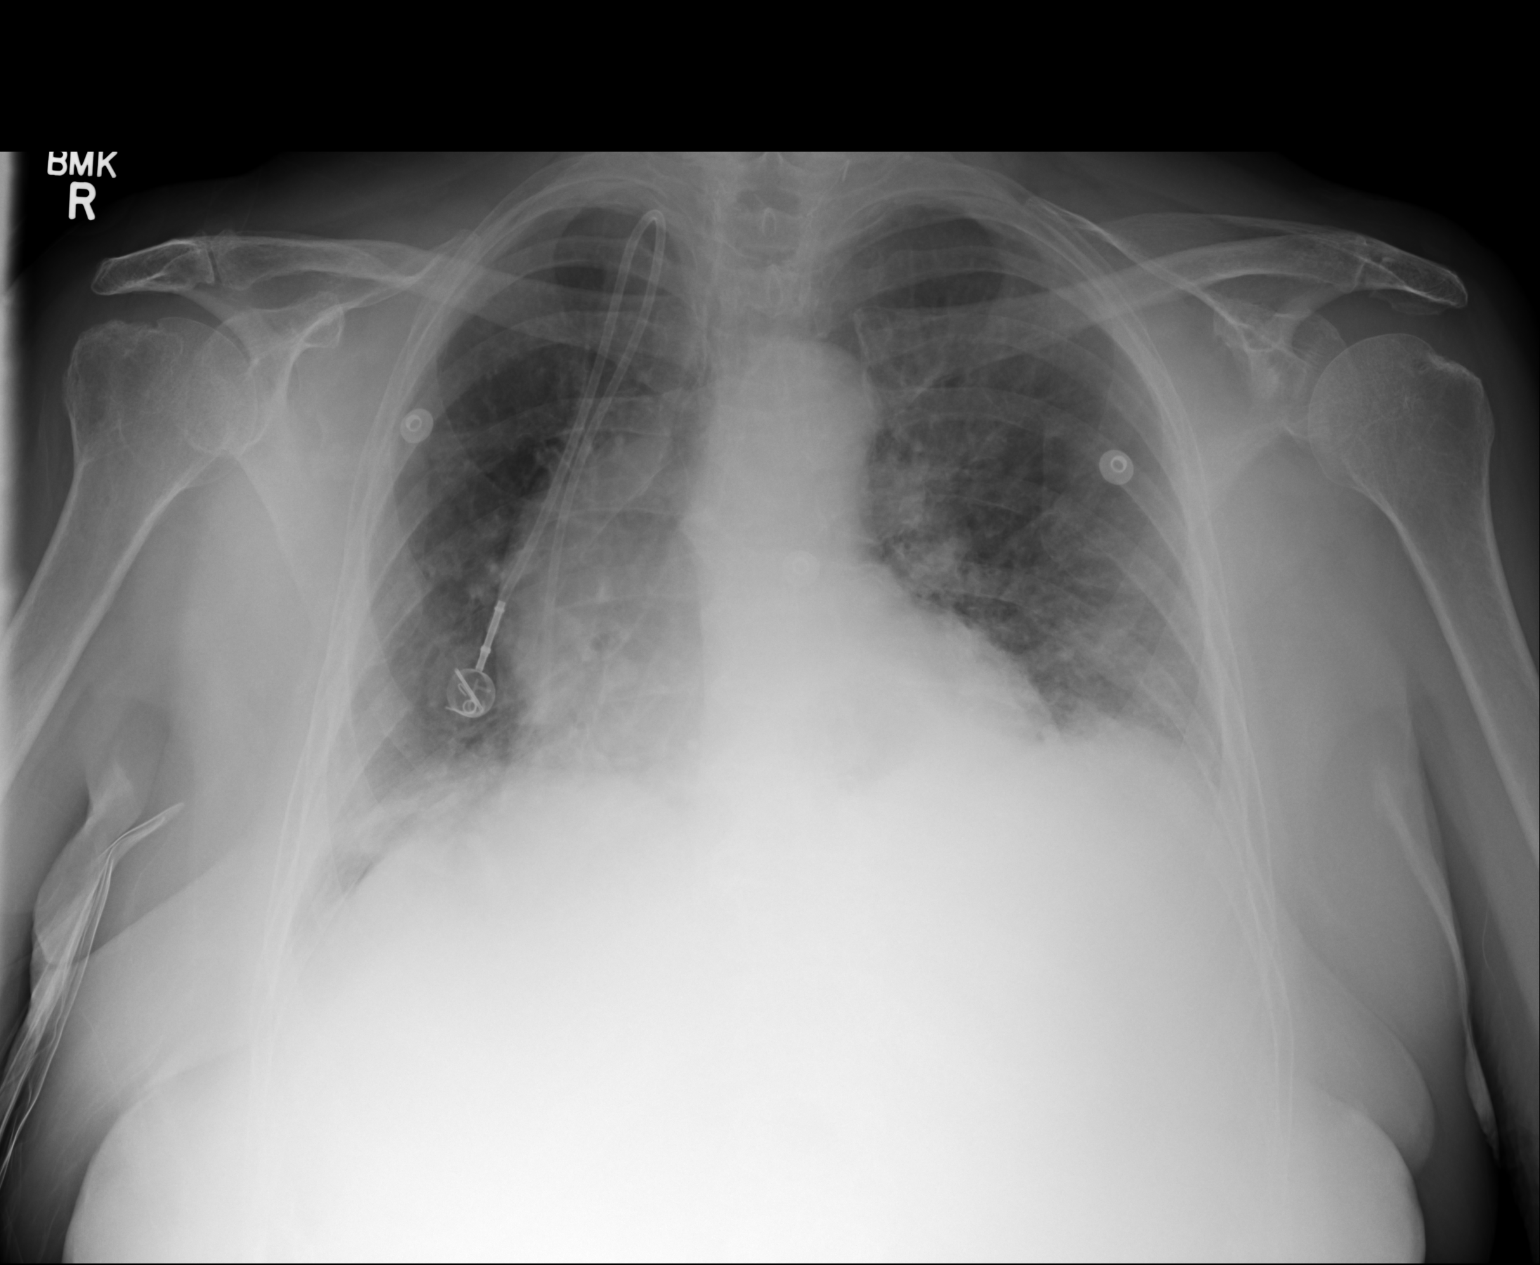

[1 of 1 positions shown; findings below may reference images not displayed]

PROCEDURE:     DXR - DXR PORTABLE CHEST SINGLE VIEW  - May 04, 2012  [DATE]

RESULT:     Single view of the chest at expiration shows right-sided
Port-A-Cath device present with the tip of the catheter in the right atrium.
There is significant reduction in the pleural fluid present on the right.
Lung markings are prominent given the expiratory nature of the film. No
definite pneumothorax is demonstrated. There is some atelectasis at the
right lung base.
IMPRESSION: Please see above.

[REDACTED]

## 2014-05-03 IMAGING — CR DG ABDOMEN 2V
1 series · 3 of 3 positions shown · non-contrast
Comparison: none

REASON FOR EXAM: abd pain
COMMENTS:

PROCEDURE:     DXR - DXR ABDOMEN 2 V FLAT AND ERECT  - May 23, 2012  [DATE]
RESULT:

[Series 1: w abdomen upright · 0.14mm/px · 3 of 3 slices shown]
[im 1/3]
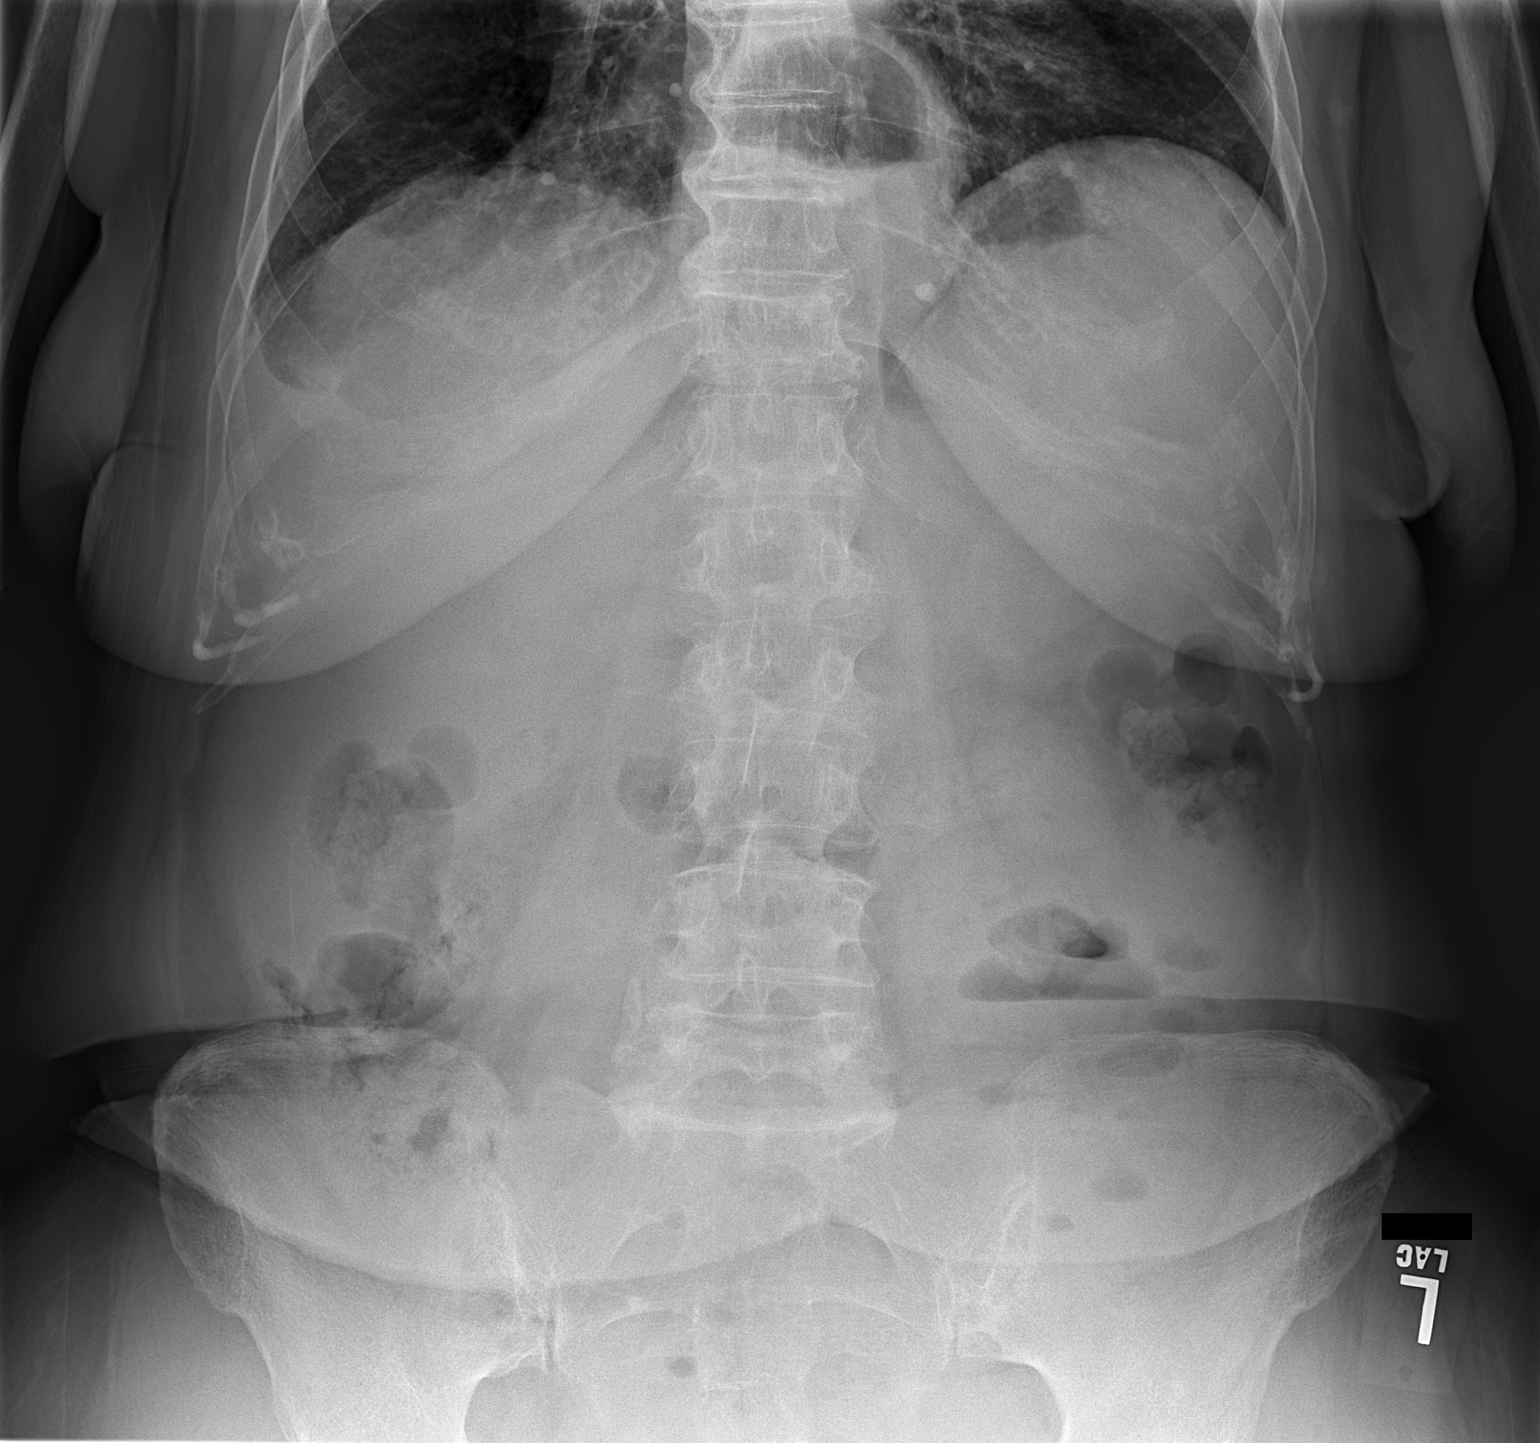
[im 2/3]
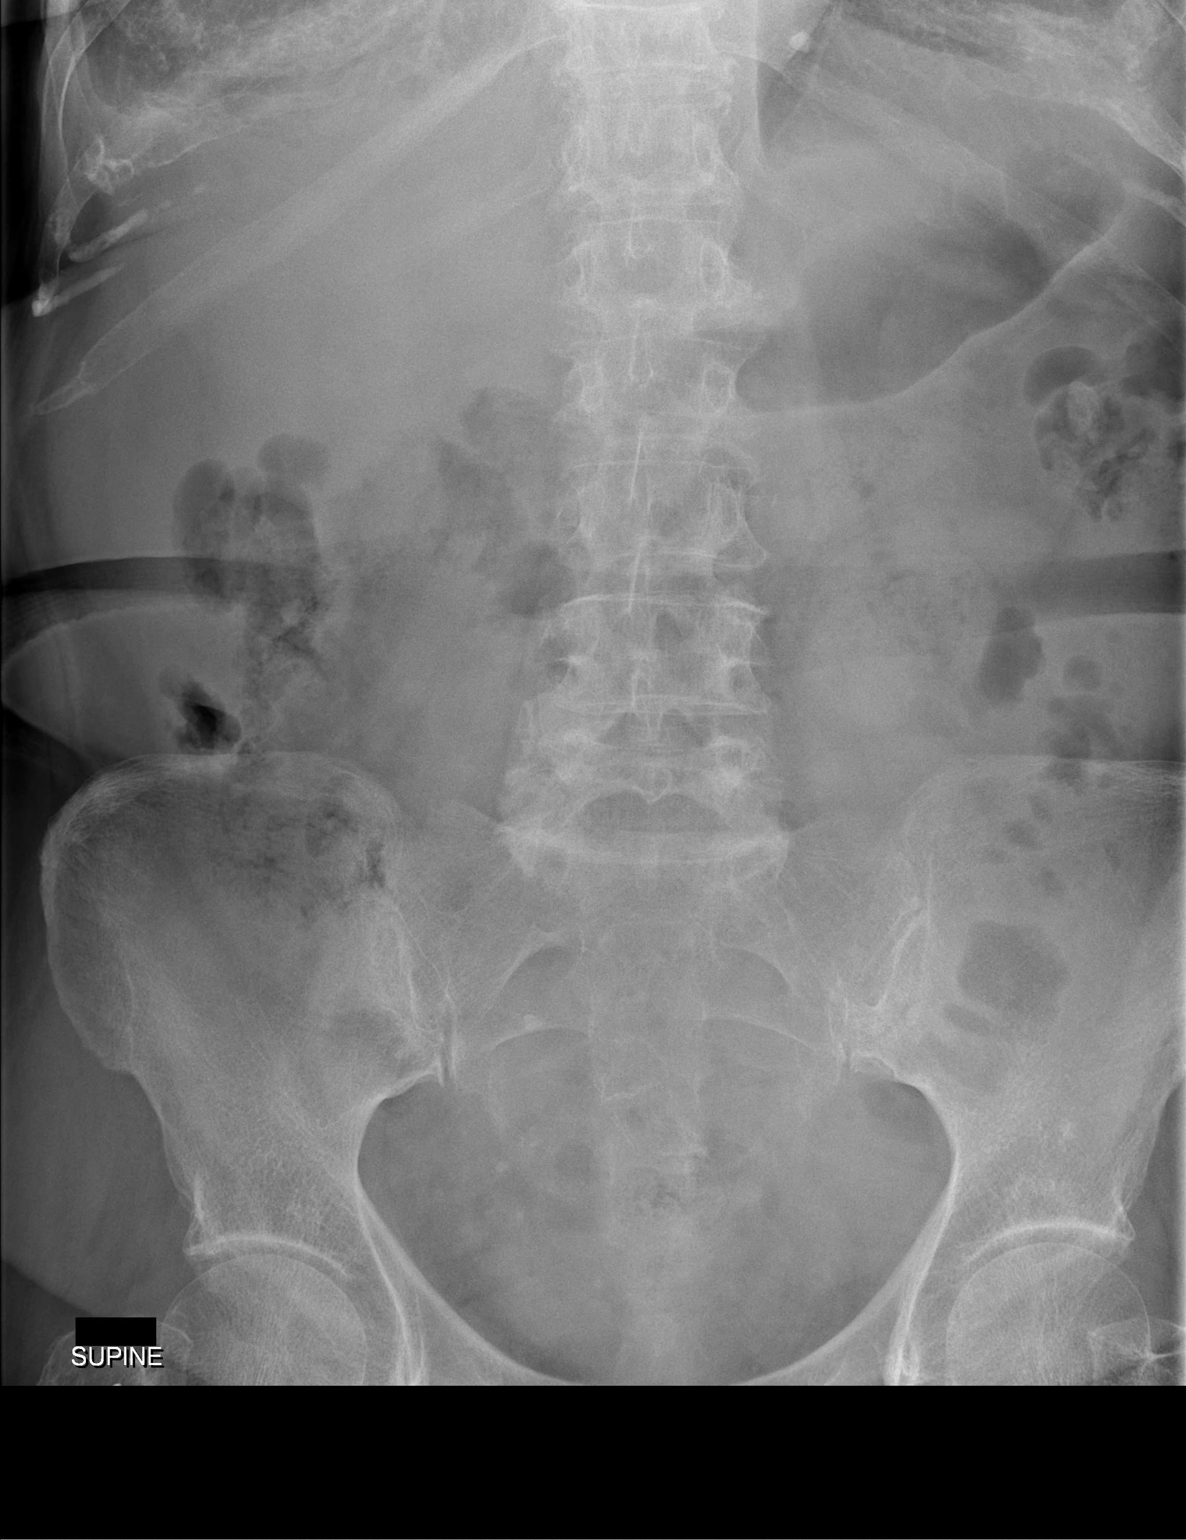
[im 3/3]
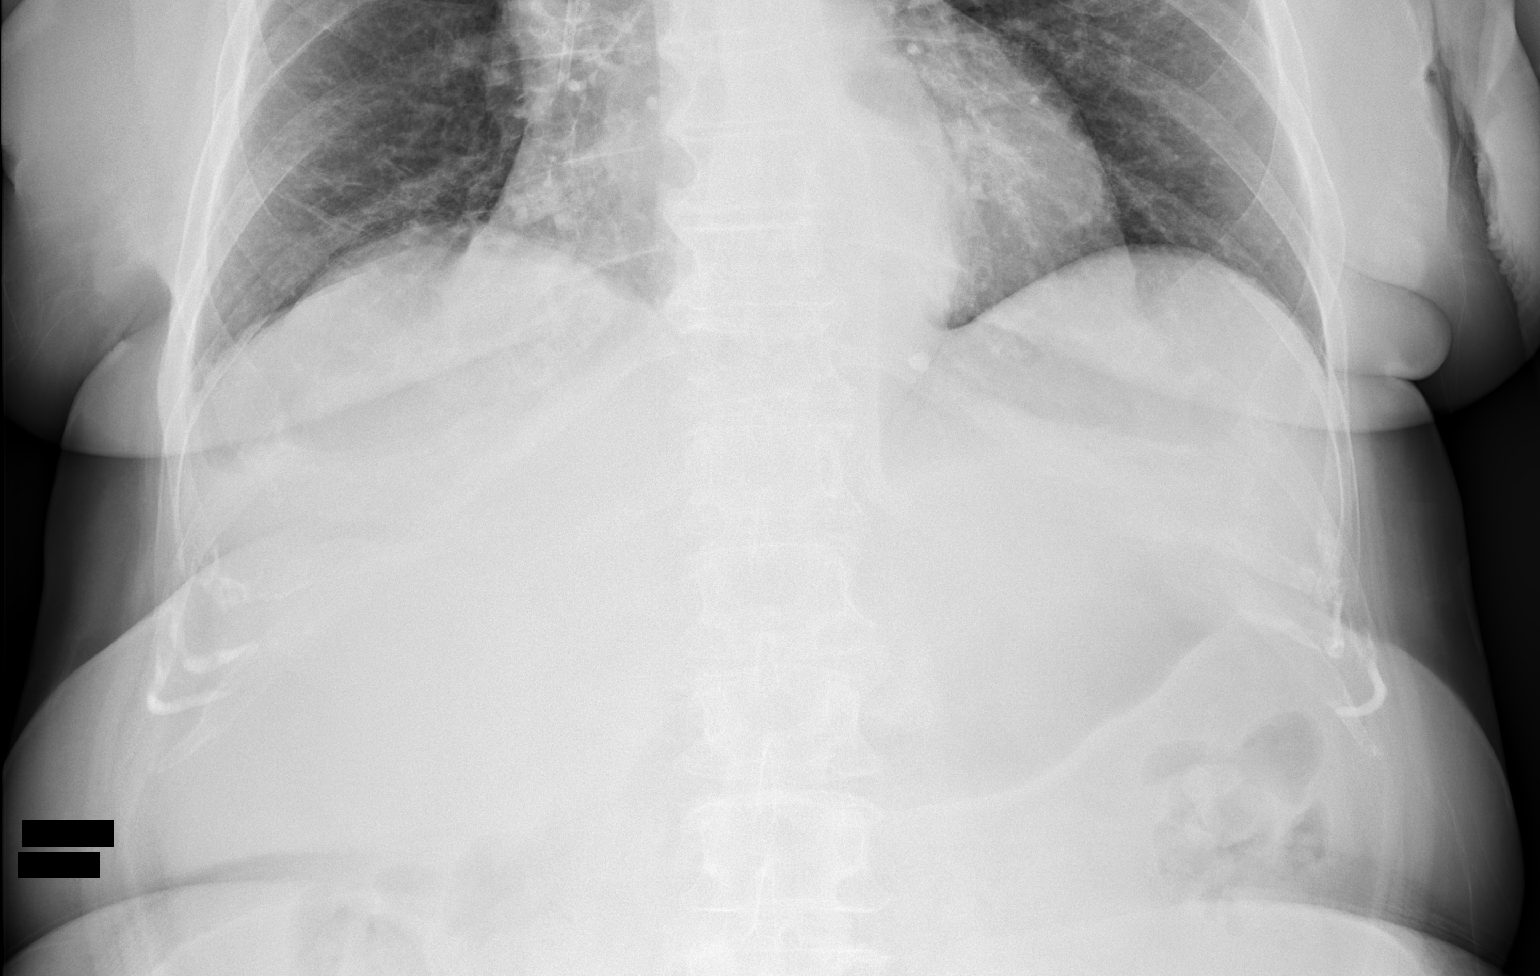

[3 of 3 positions shown; findings below may reference images not displayed]

FINDINGS: Air is seen within nondilated loops of large and small bowel. A
moderate amount of stool is appreciated within the colon. Air-fluid levels
are appreciated within the left lower quadrant and this is a nonspecific
finding.
IMPRESSION: Nonspecific, nonobstructive bowel gas pattern.

## 2014-05-03 IMAGING — CT CT ABD-PELV W/ CM
1 of 2 series · 14 of 32 positions shown, 18 images · IV contrast (isovue)
Comparison: none

REASON FOR EXAM: (1) diffuse abd pain, n/v, hx of metastatic ca; (2)
diffuse abd pain, n/v, hx of
COMMENTS:

PROCEDURE:     CT  - CT ABDOMEN / PELVIS  W  - May 23, 2012  [DATE]
RESULT:     CT abdomen pelvis dated 05/23/2012
TECHNIQUE: Helical 3 mm sections were obtained from the lung bases through
the pubic symphysis status post intravenous administration of 75 mL of
Isovue 300

[Series 2: 3mm soft tissue · axial · 0.81mm/px · z∈[+501,+900]mm · 14 of 153 slices shown, 18 images]
[im 13/153  soft-tissue]
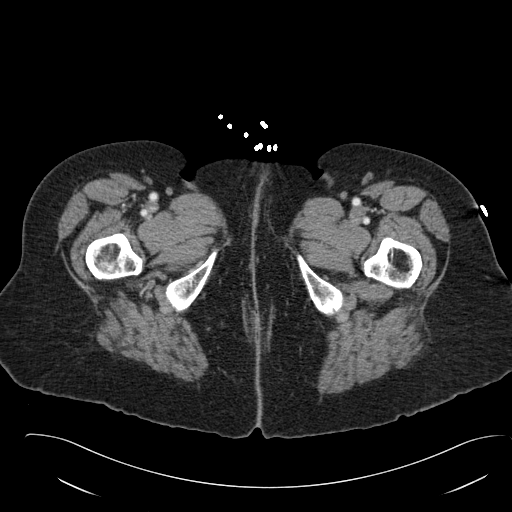
[im 13/153  bone]
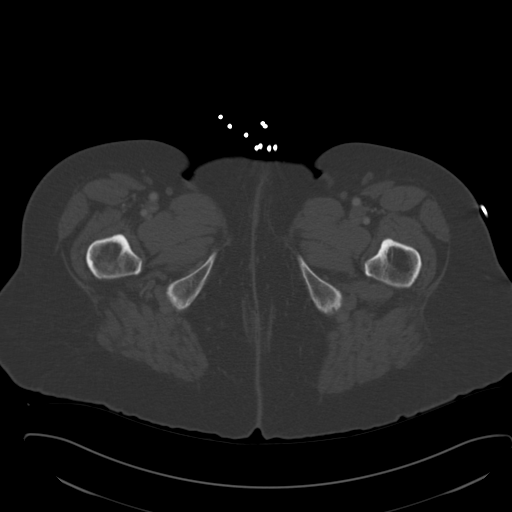
[im 25/153  soft-tissue]
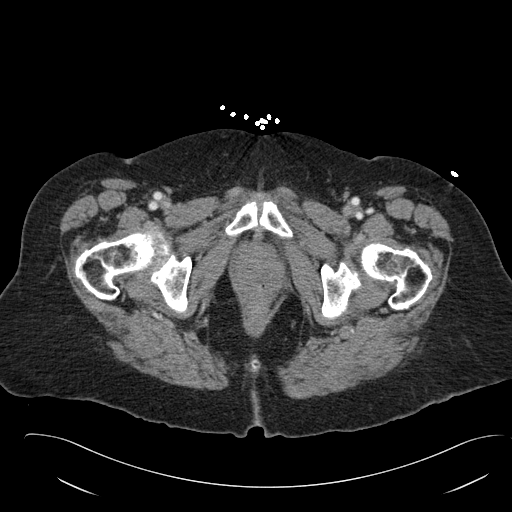
[im 37/153  soft-tissue]
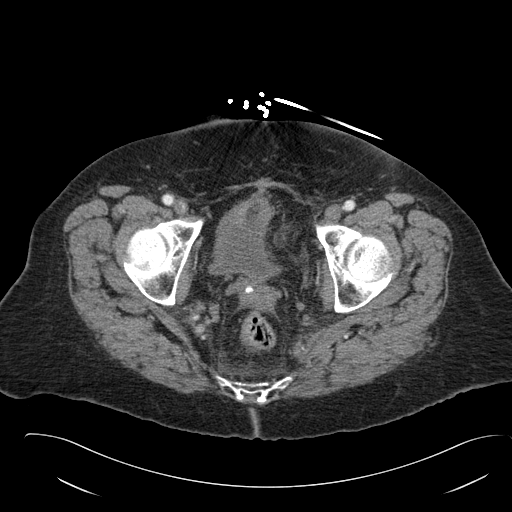
[im 49/153  soft-tissue]
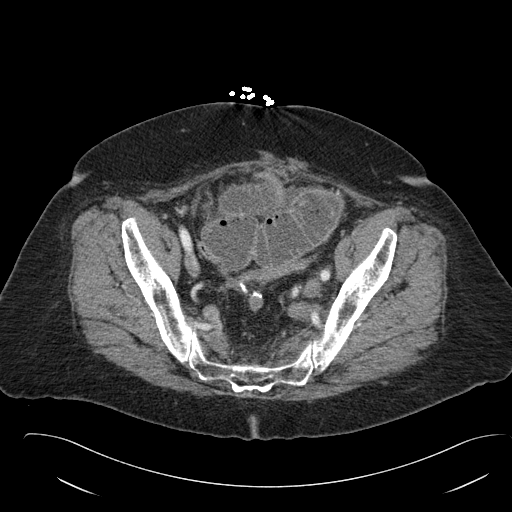
[im 61/153  soft-tissue]
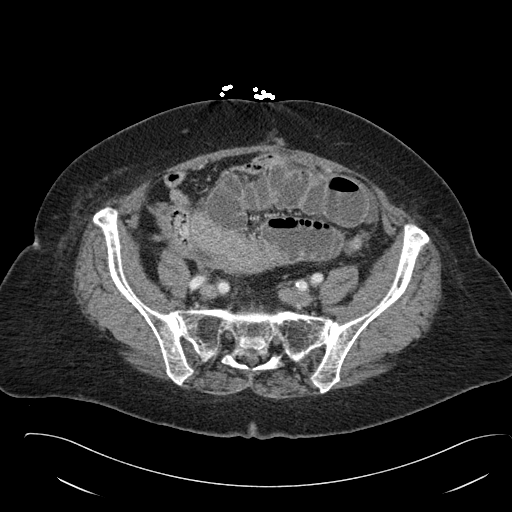
[im 73/153  soft-tissue]
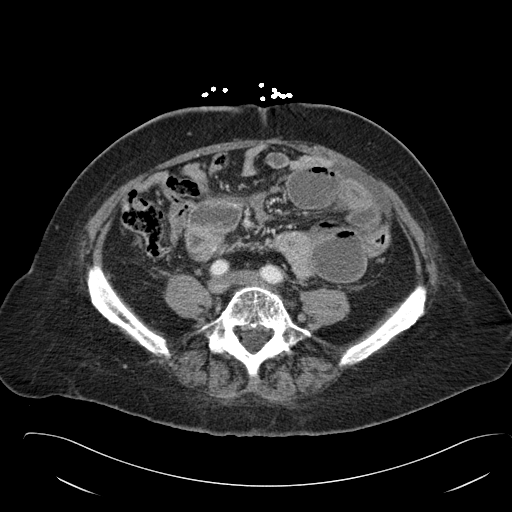
[im 86/153  soft-tissue]
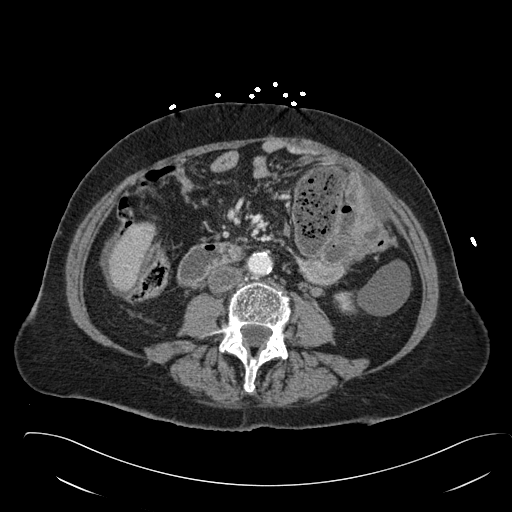
[im 98/153  soft-tissue]
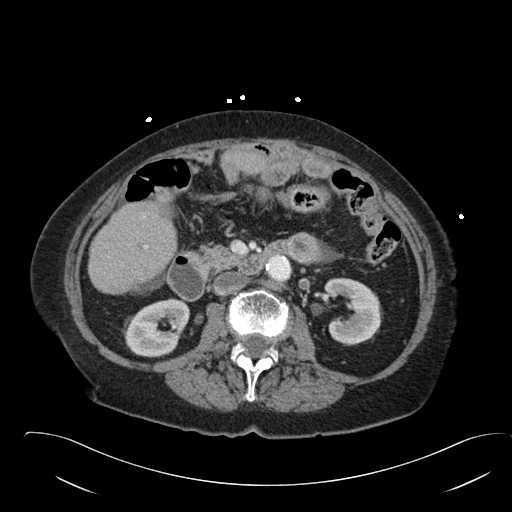
[im 110/153  soft-tissue]
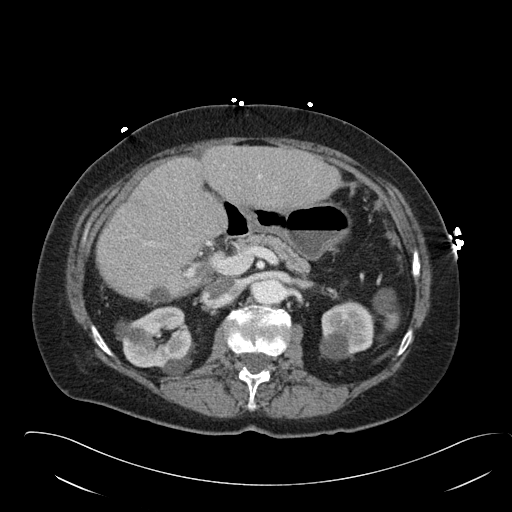
[im 110/153  bone]
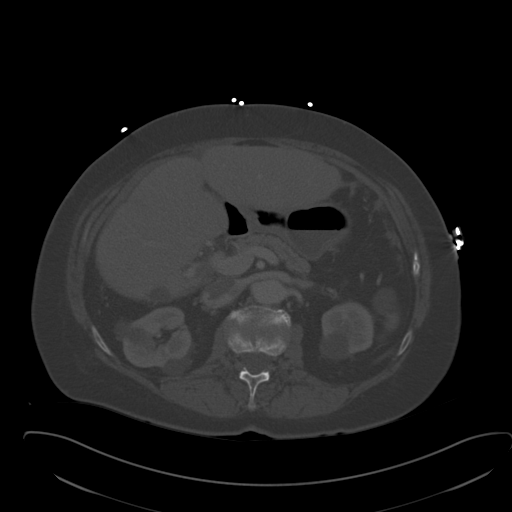
[im 122/153  soft-tissue]
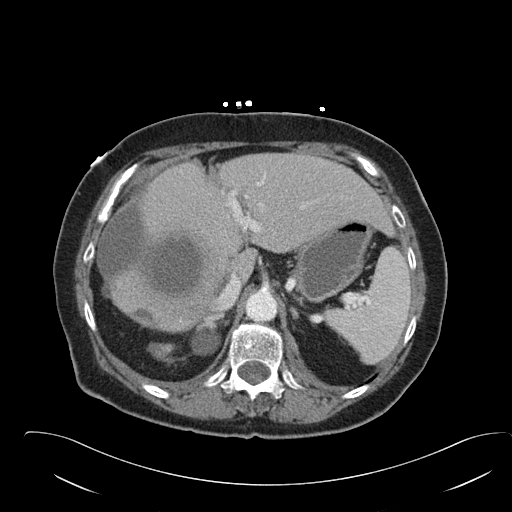
[im 128/153  lung]
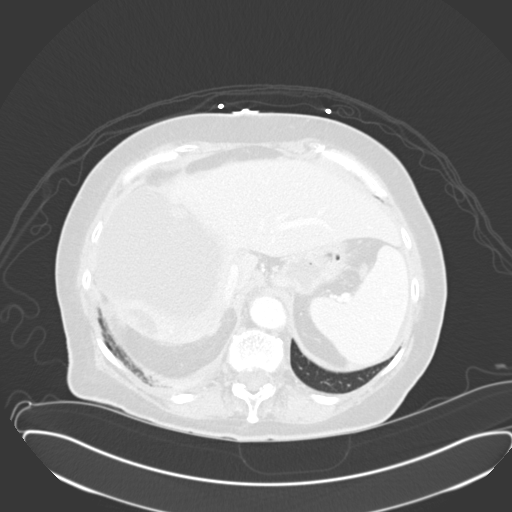
[im 134/153  soft-tissue]
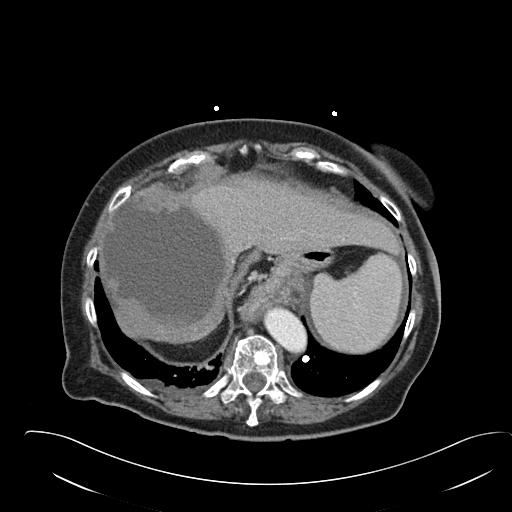
[im 134/153  lung]
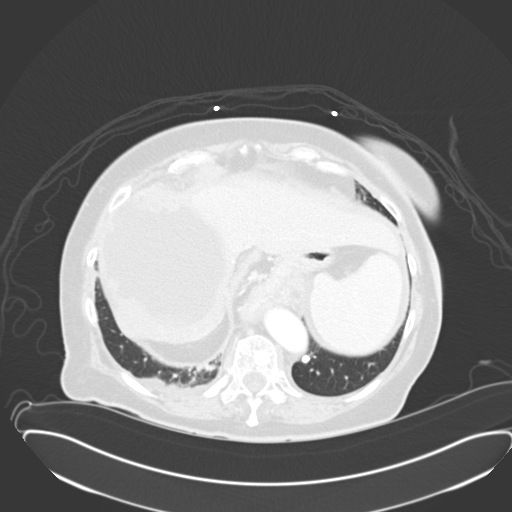
[im 140/153  lung]
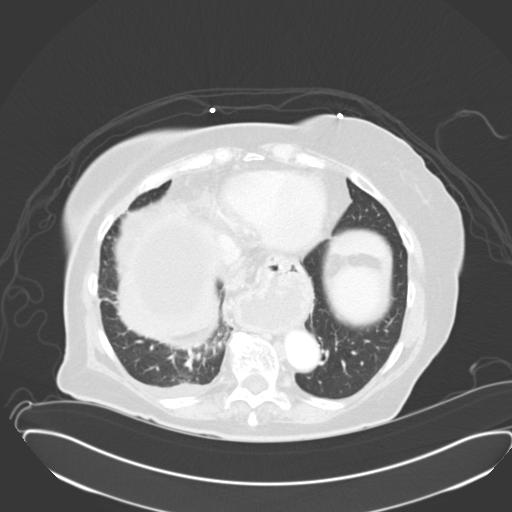
[im 146/153  soft-tissue]
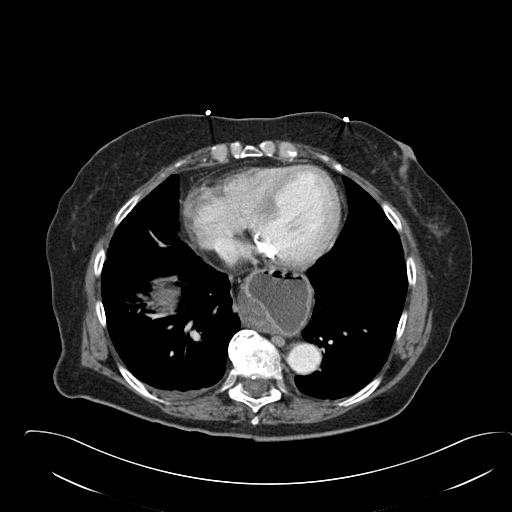
[im 146/153  lung]
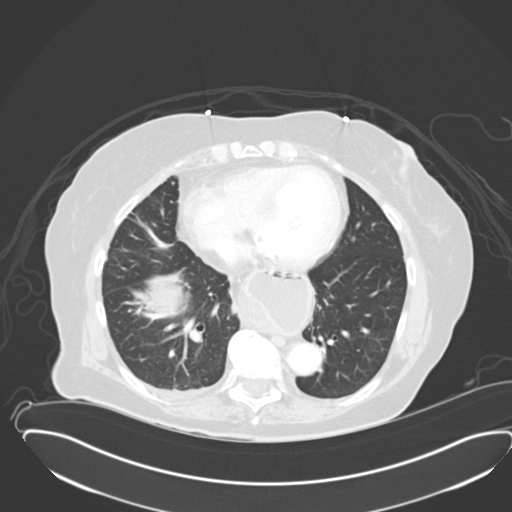

[14 of 32 positions shown; findings below may reference images not displayed]

FINDINGS: The lung bases demonstrates atelectasis versus mild infiltrate
within the right lung base. There is a trace effusion versus pleural
thickening within the dependent portion of the right lung base. A moderate
sized hiatal hernia is appreciated. The Zeinab described lymph nodes
adjacent to the hernia once again appreciated.

There has been interval increased size of the subcapsular primarily cystic
area of metastatic disease involving the right lobe of the liver. This area
measures 12.89 x 10.94 cm in AP by transverse dimensions. A second smaller
area is also identified along the posterior dome of the liver. The spleen,
adrenals, pancreas are unremarkable. The kidneys demonstrate bilateral renal
cysts without evidence of hydronephrosis. There is no evidence of solid
masses nor calculi.

Multiple dilated fluid and air-filled loops of small bowel are identified
within the lower anterior aspect of the abdomen extending into the pelvis.
There is evidence of fecal sedation of stool within loops of small bowel.
There appears to be decompressed loops of distal small bowel.

An area of fluid projects along the anterolateral aspect of the abdominal
wall with findings also concerning for mild soft tissue component. This
finding is appreciated on image 68 of the soft tissue windows.

There is no evidence of drainable loculated fluid collections nor
significant free fluid. Is no evidence of abdominal aortic aneurysm. The
inferior vena cava just proximal to the intrahepatic portion demonstrates an
area are present in either mixing of contrasted and noncontrasted blood
versus possibly a filling defect raise with concern of thrombus. This
finding is appreciated on image
42 soft tissue windows.
IMPRESSION: 1. Small bowel obstruction surgical consultation recommended if clinically
warranted
2. Hiatal hernia
3. Increased size of the perihepatic/hepatic area of metastatic disease
4. Findings which may represent incomplete opacification of venous blood
flow thrombus within the inferior vena cava cannot be excluded
5. Dr. Testo of the emergency department was informed of these findings
at the time of initial interpretation.

## 2014-05-06 NOTE — Consult Note (Signed)
Details:    - This is a memo  to   be attached to the patient's record and being    dictated  with request from  protocol monitor.   Mrs. Flax is   on [employer] protocol for ovarian cancer,  1.  Postoperative CT scan dated March 18 2011    This scan identified    small nondiagnostic lymph node (intrathoracic) measured  1.2, and of extracapsular fluid which was subdiaphragmatic.   These findings were never are diagnostic  of cancer    On serial CT scan these findings and remained stable and size remained veriable (within range of volume averaging with CT scan)  Patient was stage IIIc NED ,postoperatively and remains same as  per last  CT scan , without any evidence of recurrent disease,  All of her x-rays and CT scan has been reviewed in our tumor conference.   Electronic Signatures: Laddie Aquashoksi, Nosson Wender K (MD)  (Signed [phone]-Nov-13 15:43)  Authored: Details Mallatratt, Nedra HaiLee (RN)  (Signed [phone]-Nov-13 16:49)  Authored: Details   Last Updated: 13-Nov-13 16:49 by Sunday CornMallatratt, Lee (RN)

## 2014-05-09 NOTE — Consult Note (Signed)
Brief Consult Note: Diagnosis: mid small bowel obstruction.   Patient was seen by consultant.   Consult note dictated.   Recommend to proceed with surgery or procedure.   Orders entered.   Comments: She is most interested in palliation and quality of life and independance and understands curative therapy is impossible. Pt undecided about palliative operative therapy or palliative non-operative therapy. Fortunately the situation does not demand an immediate answer. I Rxed Flagyl for bacterial overgrowth in fecalized proximal small bowel which may be contributing to diarrhea and also consulted Dr Evangeline GulaPfipher to see in teh AM, after her daughter visits tonight.  Electronic Signatures: Claude MangesMarterre, Kristof Nadeem F (MD)  (Signed [phone]-May-14 16:22)  Authored: Brief Consult Note   Last Updated: 07-May-14 16:22 by Claude MangesMarterre, Davonta Stroot F (MD)

## 2014-05-09 NOTE — Consult Note (Signed)
PATIENT NAME:  Kristi Hickman, Kristi Hickman MR#:  [id number] DATE OF BIRTH:  [birth year]  DATE OF CONSULTATION:  05/23/2012  CONSULTING PHYSICIAN:  Claude Manges, MD  HISTORY OF PRESENT ILLNESS: Ms. Kristi Hickman is a 74 year old white female with progressive ovarian cancer who developed nausea and crampy abdominal pain yesterday about lunch and then vomited all night long and presented to the Emergency Room today. She underwent bilateral oophorectomy, sigmoid colectomy, omentectomy by Dr. Hyacinth Meeker and Dr. Egbert Garibaldi on 03/01/2011. Her preoperative CA125 was 100. Her pathologic stage was T3NxM0 or stage IIIC. She was treated with chemotherapy postoperatively from March 2013 to the end of June 2013 and had residual disease and went on new drug therapy utilizing cisplatin and gemcitabine which caused intolerable nausea and weakness, and she stopped this chemotherapy approximately three weeks ago. She was placed on prednisone and a diuretic two weeks ago and her weakness and nausea improved until she experienced the symptoms mentioned earlier.  Over the last two weeks, her bowel movements have been poorly formed and her last bowel movement prior to this admission was three days ago. Since her admission this morning, she has had an enema and MiraLax and has had a bowel movement in the hospital, however. She denies fever, but has had some chills. She does deny blood in her emesis and her bowel movements.   PAST MEDICAL HISTORY: Chronic bronchitis, hypertension, hiatal hernia, gastroesophageal reflux disease, dyslipidemia, anemia, chronic arthritis, osteoporosis, migraine headaches.   PAST SURGICAL HISTORY:  Other surgeries appendectomy, tonsillectomy, hysterectomy in 1974 for fibroids.   ALLERGIES: ALEVE, CODEINE, PENICILLIN, ZOCOR, ENVIRONMENTAL.   MEDICATIONS: 1. Clobetasol topical 0.05% cream b.i.d. for lichen sclerosis. 2. Cymbalta 30 mg b.i.d.  3. Fexofenadine 180 mg daily p.r.n.  4. Ibuprofen 200 mg q.4 hours p.r.n.   5. Levothyroxine 112 mcg q.a.m.  6. TUMS 500 mg 2 to 4 tablets t.i.d. p.r.n. gastroesophageal reflux symptoms.   SOCIAL HISTORY: The patient lives alone and is independent with her activities of daily living. She does not smoke. She does not drink. She is expecting her daughter tonight as she says that her memory fails her.   FAMILY HISTORY: Positive for multiple other cancers.   REVIEW OF SYSTEMS: Negative for 10 systems except as mentioned in the history of present illness. Specifically patient denies fever, chest pain, shortness of breath, hematemesis, and hematochezia.   PHYSICAL EXAMINATION:  GENERAL: A pleasant, elderly, white female lying comfortably in the bed.   VITAL SIGNS: Height 5 feet 3 inches, weight 182 pounds, BMI 32.3. Temperature 98.6, pulse 90, respirations 20, blood pressure 118/81, oxygen saturation 91% on room air at rest.   HEENT: Pupils equally round and reactive to light. Extraocular movements intact. Sclerae anicteric. Oropharynx clear. Mucous membranes moist. Hearing intact to voice.   NECK: Supple with no tracheal deviation or jugular venous distention.   HEART: Regular rate and rhythm with no murmurs or rubs.   LUNGS: Clear to auscultation with normal respiratory effort bilaterally.   ABDOMEN: Soft and nondistended, and nontender. She has a midline scar and I doubt she has any ventral hernias although a vigorous examination was not performed.   EXTREMITIES: Mild edema bilaterally with normal capillary refill.   NEUROLOGIC: Cranial nerves II through XII, motor and sensation grossly intact.   PSYCHIATRIC: Alert and oriented x4. Appropriate affect.   LABORATORY, DIAGNOSTIC, AND RADIOLOGICAL DATA: White blood cell count 10,000 (up from 4000 five days ago) hemoglobin 13 (up from 10.5), hematocrit 39% (up from 32%), and platelet count  471,000. Electrolytes show a potassium of 2.6, chloride 87, CO2 39, creatinine is up from 1.0 to 1.2. Sodium is normal. Hepatic  profile is normal. PT/INR and PTT normal. Urinalysis normal. CT scan of the abdomen and pelvis with IV and oral contrast reveals an increased size of the right lobe of the liver or cystic metastasis now to 13 x 11 cm.  There is associated atrophy of the right lobe of the liver with marked hypertrophy of the left lateral segment and significant hypertrophy of the left medial segment. There are multiple dilated fluid and air-filled loops of proximal small intestine with some fecalization of the contents as well as decompressed loops of distal small bowel. There is no obvious mass near transition zone. I reviewed the CT scan myself.   ASSESSMENT: Partial mid small bowel obstruction in a patient with hepatic metastatic persistent ovarian cancer. In all likelihood, this is due to metastatic disease; but, as I discussed with the patient, this metastatic disease could be focal or limited, or it could be widespread / significant and involve other significant structures. She also understands that the possibility exists that the cause of the bowel obstruction is benign (although this is significantly less likely). We discussed the overgrowth of bacteria in her proximal intestine and she agreed to a trial of oral Flagyl. We also discussed the possibility of successfully managing this event nonoperatively and if succesful that she ought to have her diet advanced to a very low fiber or low residue diet in order to maintain patency of the small intestine through the partially constricted area. And we also discussed the likelihood of bowel obstruction causing a significant change in the quality (and possibly length) of her life.  We discussed simple surgery such as a limited adhesiolysis, a slightly more complicated surgery such as a enterectomy to involve pathologic intestine with a primary anastomosis, a more involved scenario wherein metastatic disease could not be removed and the patient would require a bypass or  enteroenterostomy as well as the possibility of ostomy therapy. We also discussed gastrostomy tube placement either to be done alone or in junction with one of the previously mentioned procedures. She understands that there is nonoperative management for malignant bowel obstruction but that that is typically a terminal decision and we discussed her goals of care at length. She is very interested in maintaining independence and avoiding pain and other distressing symptoms such as nausea and vomiting and is much more concerned with her quality of life then extending her quantity. To that end, she was interested in speaking with our palliative care physician, Dr. Harriett SineNancy Phifer, tomorrow and she understands that one of the disadvantages of choosing nonoperative therapy is that she may miss an opportunity for a fairly simple operation.  She also understands that one of the disadvantages of choosing operative therapy is that sometimes the malignant findings are so extensive that the she may never regain a significant quality of life where she can have future meaningful interaction with loved ones.   We will continue to follow this patient with you.   ____________________________ Claude MangesWilliam F. Donnamae Muilenburg, MD wfm:rw D: 05/23/2012 16:16:25 ET T: 05/23/2012 16:44:58 ET JOB#: [id number]  cc: Claude MangesWilliam F. Marni Franzoni, MD, <Dictator> Bari EdwardLaura Berglund, MD Gerome SamJanak K. Doylene Canninghoksi, MD  Claude MangesWILLIAM F Najiyah Paris MD ELECTRONICALLY SIGNED 05/25/2012 5:08

## 2014-05-09 NOTE — Discharge Summary (Signed)
Dates of Admission and Diagnosis:  Date of Admission 23-May-2012   Date of Discharge 28-May-2012   Admitting Diagnosis Small bowel obstruction   Final Diagnosis Small bowel obstruction due to her recurrent and progressive ovarian cancer    Chief Complaint/History of Present Illness 74 year old lady with recurrent and progressive carcinoma   ovary, in the emergency room since [place] called this morning with complaints of severe nausea vomiting abdominal discomfort.  Had a CT scan which shows progressive disease.  The possibility of peritoneal implants and small bowel obstruction.  Patient in the past has declined chemotherapy.  Has a previous treatment under GOG protocol ratecarboplatin and Taxol plus minus anti vgef medication. No Chills.  No fever.  See that abdominal crampy pain.  Persistent nausea and vomiting   Chief Complaint/History of Present Illness cont'd 82. 74 year old lady with history of ovarian cancer stage IIIc status post bilateral oopherectomy, sigmoid resection, omentectomy.  March 01, 2011. (preop CA 125 was 100) Pathological staging T3 Nx M0,  stage IIIc. 2. Started GOG protocol March of 2013 carboplatinum /Taxol  and AMG/placebo 3. Finished Carboplatinum and Taxol on July 15, 2011 (received 6 cycles of chemotherapy as per protocol without any dose reduction) 4. Patient is starting cis-platinum and gemcitabine fromApril of 2014 5.chemotherapy was discontinued after one treatment because of nausea and vomiting.   Thyroid:  08-May-14 05:04   Thyroid Stimulating Hormone  8.15 (0.45-4.50 (International Unit)  ----------------------- Pregnant patients have  different reference  ranges for TSH:  - - - - - - - - - -  Pregnant, first trimetser:  0.36 - 2.50 uIU/mL)  Hepatic:  08-May-14 05:04   Bilirubin, Total 0.4  Alkaline Phosphatase 102  SGPT (ALT) 18  SGOT (AST) 19  Total Protein, Serum  5.7  Albumin, Serum  2.5  10-May-14 04:45   Bilirubin, Total 0.3   Alkaline Phosphatase 106  SGPT (ALT) 17  SGOT (AST) 17  Total Protein, Serum  5.9  Albumin, Serum  2.7  General Ref:  08-May-14 05:04   Thyroxine (T4) ========== TEST NAME ==========  ========= RESULTS =========  = REFERENCE RANGE =  THYROXINE(T4)  Thyroxine (T4) Thyroxine (T4)                  [   7.2 ug/dL            ]          4.5-12.0               [facility]            No: [id number]           [address], [place], [place] [address]           Lindon Romp, MD         [phone]   Result(s) reported on 25 May 2012 at 05:17AM.  Routine Chem:  08-May-14 05:04   Glucose, Serum 88  BUN  20  Creatinine (comp) 1.17  Sodium, Serum 141  Potassium, Serum  3.4  Chloride, Serum 98  CO2, Serum  38  Calcium (Total), Serum  7.0  Anion Gap  5  Osmolality (calc) 283  eGFR (African American)  54  eGFR (Non-African American)  46 (eGFR values <31m/min/1.73 m2 may be an indication of chronic kidney disease (CKD). Calculated eGFR is useful in patients with stable renal function. The eGFR calculation will not be reliable in acutely ill patients when serum creatinine is changing rapidly. It is not  useful in  patients on dialysis. The eGFR calculation may not be applicable to patients at the low and high extremes of body sizes, pregnant women, and vegetarians.)  Result Comment CALCIUM - RESULTS VERIFIED BY REPEAT TESTING.  - NOTIFIED OF CRITICAL VALUE  - CALLED RESULT TO TRACY INGLE AT 4158  - 05/24/12-DAC  - READ-BACK PROCESS PERFORMED.  Result(s) reported on 24 May 2012 at 05:48AM.  Magnesium, Serum  1.4 (1.8-2.4 THERAPEUTIC RANGE: 4-7 mg/dL TOXIC: > 10 mg/dL  -----------------------)  10-May-14 04:45   Glucose, Serum  185  BUN 9  Creatinine (comp) 0.84  Sodium, Serum 140  Potassium, Serum  3.1  Chloride, Serum 102  CO2, Serum 32  Calcium (Total), Serum  7.8  Anion Gap  6  Osmolality (calc) 283  eGFR (African American) >60  eGFR (Non-African  American) >60 (eGFR values <54m/min/1.73 m2 may be an indication of chronic kidney disease (CKD). Calculated eGFR is useful in patients with stable renal function. The eGFR calculation will not be reliable in acutely ill patients when serum creatinine is changing rapidly. It is not useful in  patients on dialysis. The eGFR calculation may not be applicable to patients at the low and high extremes of body sizes, pregnant women, and vegetarians.)  Result Comment LABS - This specimen was collected through an   - indwelling catheter or arterial line.  - A minimum of 572m of blood was wasted prior    - to collecting the sample.  Interpret  - results with caution.  Result(s) reported on 26 May 2012 at 07:24AM.  11-May-14 05:40   Glucose, Serum 88  BUN 14  Creatinine (comp) 0.83  Sodium, Serum 143  Potassium, Serum 3.5  Chloride, Serum 107  CO2, Serum  33  Calcium (Total), Serum  7.3  Anion Gap  3  Osmolality (calc) 285  eGFR (African American) >60  eGFR (Non-African American) >60 (eGFR values <6030min/1.73 m2 may be an indication of chronic kidney disease (CKD). Calculated eGFR is useful in patients with stable renal function. The eGFR calculation will not be reliable in acutely ill patients when serum creatinine is changing rapidly. It is not useful in  patients on dialysis. The eGFR calculation may not be applicable to patients at the low and high extremes of body sizes, pregnant women, and vegetarians.)  Routine Coag:  08-May-14 05:04   Prothrombin 14.7  INR 1.1 (INR reference interval applies to patients on anticoagulant therapy. A single INR therapeutic range for coumarins is not optimal for all indications; however, the suggested range for most indications is 2.0 - 3.0. Exceptions to the INR Reference Range may include: Prosthetic heart valves, acute myocardial infarction, prevention of myocardial infarction, and combinations of aspirin and anticoagulant. The need for a  higher or lower target INR must be assessed individually. Reference: The Pharmacology and Management of the Vitamin K  antagonists: the seventh ACCP Conference on Antithrombotic and Thrombolytic Therapy. CheXENMM.7680pt:126 (3suppl): [id number] HCT value >55% may artifactually increase the PT.  In one study,  the increase was an average of 25%. Reference:  "Effect on Routine and Special Coagulation Testing Values of Citrate Anticoagulant Adjustment in Patients with High HCT Values." American Journal of Clinical Pathology 2006;126:400-405.)  Activated PTT (APTT) 34.1 (A HCT value >55% may artifactually increase the APTT. In one study, the increase was an average of 19%. Reference: "Effect on Routine and Special Coagulation Testing Values of Citrate Anticoagulant Adjustment in Patients with High HCT Values." American Journal of Clinical  Pathology 4098;119:147-829.)  Routine Hem:  08-May-14 05:04   WBC (CBC) 6.8  RBC (CBC) 4.12  Hemoglobin (CBC)  10.5  Hematocrit (CBC)  31.1  Platelet Count (CBC) 357  MCV  76  MCH  25.4  MCHC 33.7  RDW  17.8  Neutrophil % 72.4  Lymphocyte % 13.5  Monocyte % 12.3  Eosinophil % 1.0  Basophil % 0.8  Neutrophil # 4.9  Lymphocyte #  0.9  Monocyte # 0.8  Eosinophil # 0.1  Basophil # 0.1 (Result(s) reported on 24 May 2012 at 05:48AM.)  10-May-14 04:45   WBC (CBC) 6.9  RBC (CBC) 4.50  Hemoglobin (CBC)  11.4  Hematocrit (CBC)  33.9  Platelet Count (CBC) 325  MCV  75  MCH  25.3  MCHC 33.6  RDW  17.4  Bands 1  Segmented Neutrophils 91  Lymphocytes 4  Monocytes 1  Diff Comment 1 ANISOCYTOSIS  Diff Comment 2 POLYCHROMASIA  Diff Comment 3 MICROCYTES PRESENT  Metamyelocyte 2  Myelocyte 1  Diff Comment 4 PLTS VARIED IN SIZE  Result(s) reported on 26 May 2012 at 07:24AM.  11-May-14 05:40   WBC (CBC)  40.0  RBC (CBC) 4.07  Hemoglobin (CBC)  10.1  Hematocrit (CBC)  31.1  Platelet Count (CBC) 314 (Result(s) reported on 27 May 2012 at 06:51AM.)   MCV  77  MCH  24.7  MCHC 32.3  RDW  18.2  Bands 8  Segmented Neutrophils 84  Lymphocytes 3  Monocytes 5  Diff Comment 1 ANISOCYTOSIS  Diff Comment 2 MICROCYTES PRESENT  Diff Comment 3 NORMAL PLT MORPHOLGY  Result(s) reported on 27 May 2012 at 06:51AM.   PERTINENT RADIOLOGY STUDIES: LabUnknown:    07-May-14 08:55, CT Abdomen and Pelvis With Contrast  PACS Image   CT:  CT Abdomen and Pelvis With Contrast   REASON FOR EXAM:    (1) diffuse abd pain, n/v, hx of metastatic ca; (2)   diffuse abd pain, n/v, hx of  COMMENTS:       PROCEDURE: CT  - CT ABDOMEN / PELVIS  W  - May 23 2012  8:55AM     RESULT: CT abdomen pelvis dated 05/23/2012    Technique: Helical 3 mm sections were obtained from the lung bases   through the pubic symphysis status post intravenous administration of 75   mL of Isovue 300    Findings: The lung bases demonstrates atelectasis versus mild infiltrate   within the right lung base. There is a trace effusion versus pleural   thickening within the dependent portion of the right lung base. A     moderate sized hiatal hernia is appreciated. The priest described lymph   nodes adjacent to the hernia once again appreciated.    There has been interval increased size of the subcapsular primarily   cystic area of metastatic disease involving the right lobe of the liver.   This area measures 12.89 x 10.94 cm in AP by transverse dimensions. A   second smaller area is also identified along the posterior dome of the   liver. The spleen, adrenals, pancreas are unremarkable. The kidneys   demonstrate bilateral renal cysts without evidence of hydronephrosis.   There is no evidence of solid masses nor calculi.    Multiple dilated fluid and air-filled loops of small bowel are identified   within the lower anterior aspect of the abdomen extending into the   pelvis. There is evidence of fecal sedation of stool within loops of  small bowel. There appears to be decompressed  loops of distal small bowel.  An area of fluid projects along the anterolateral aspect of the abdominal   wall with findings also concerning for mild soft tissue component. This   finding is appreciated on image 68 of the soft tissue windows.    There is no evidence of drainable loculated fluid collections nor   significant free fluid. Is no evidence of abdominal aortic aneurysm. The   inferior vena cava just proximal to the intrahepatic portion demonstrates   an area are present in either mixing of contrasted andnoncontrasted   blood versus possibly a filling defect raise with concern of thrombus.   This finding is appreciated on image  42 soft tissue windows.    IMPRESSION:   1. Small bowel obstruction surgical consultation recommended if   clinically warranted  2. Hiatal hernia  3. Increased size of the perihepatic/hepatic area of metastatic disease   4. Findings which may represent incomplete opacification of venous blood   flow thrombus within the inferior vena cava cannot be excluded  5. Dr. Benjaman Lobe of the emergency department was informed of these   findings at the time of initial interpretation.        Verified By: Mikki Santee, M.D., [place] [facility] Course During hospital stay patient was started on intravenous fluid.    had conservative management of small bowel obstruction.  CT scan followed by 3 way  abdomen revealed progressive decreased in air-fluid .  Palliative care consult as the request of surgeon was obtained.  Surgical consultation was obtained . After prolonged discussion was decided patient does not want any surgical intervention and was admitted to try chemotherapy.  Doxil was given intravenously.   Condition on Discharge Satisfactory   DISCHARGE INSTRUCTIONS HOME MEDS:  Medication Reconciliation: Patient's Home Medications at Discharge:     Medication Instructions  clobetasol topical 0.05% topical cream  Apply topically to affected  area 2 times a day, As Needed for lichen sclerosis   fexofenadine 180 mg oral tablet  1 tab(s) orally once a day, As Needed for allergies   ibuprofen 200 mg oral tablet  1 tab(s) orally every 4 hours, As Needed - for Pain   duloxetine 30 mg oral delayed release capsule  1 cap(s) orally once a day   potassium chloride 20 meq oral tablet, extended release  1 tab(s) orally 2 times a day   omeprazole 20 mg oral delayed release capsule  1 cap(s) orally 2 times a day   levothyroxine 112 mcg (0.112 mg) oral tablet  1 tab(s) orally once a day   duragesic-12 12 mcg/hr transdermal film, extended release  1 patch transdermal every 72 hours   prednisone 10 mg oral tablet  6 tabs x 1 daytabs x 1 daytabs x 1 daytabs x 1 daytabs x 1 daytab x 1 day     Physician's Instructions:  Home Health? No   Treatments None   Home Oxygen? No   Diet Regular   Activity Limitations None   Referrals None   Return to Work Not Applicable   Time frame for Follow Up Appointment 1-2 weeks   Electronic Signatures: Jobe Gibbon (MD)  (Signed 26-May-14 18:22)  Authored: ADMISSION DATE AND DIAGNOSIS, CHIEF COMPLAINT/HPI, PERTINENT LABS, [place], PATIENT INSTRUCTIONS   Last Updated: 26-May-14 18:22 by Jobe Gibbon (MD)

## 2014-05-10 NOTE — Consult Note (Signed)
   Comments   Consulted for patient with stage IV ovarian cancer who presented to the ER with intractable abd pain. CT noted likely c/w dz progression. Pt says she recently came off chemotx and has no plans for further treatment. She has received hydromorphone 0.5mg  x2 which has significantly improved her pain and is being sent home with rx for po hydromorphone. Pt has follow up appt scheduled for Dr Doylene Canninghoksi later this month. Pt is currently being followed by [facility] and says she plans to transition to Hospice and that this is "already in the works". She says her goal is to remain comfortable for "whatever time I have left". I discussed the resources available to her in the cancer center including the palliative care clinic should she need assistance with symptom mgt. All questions answered.   Electronic Signatures: Borders, Daryl EasternJoshua R (NP)  (Signed 06-Feb-15 16:55)  Authored: Palliative Care   Last Updated: 06-Feb-15 16:55 by Malachy MoanBorders, Joshua R (NP)

## 2014-05-11 NOTE — Op Note (Signed)
PATIENT NAME:  Kristi Hickman, HOSEA MR#:  [id number] DATE OF BIRTH:  [birth year]  DATE OF PROCEDURE:  03/01/2011  PREOPERATIVE DIAGNOSIS: Pelvic mass.   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   PROCEDURES PERFORMED:  1. Exploratory laparotomy.  2. Bilateral salpingo-oophorectomy.  3. Tumor reductive surgery.  4. Omentectomy.  5. Sigmoid resection with reanastomosis.   CO-SURGEONS: Maxine Glenn, M.D. and Natale Lay, M.D.   ANESTHESIA: General.   ESTIMATED BLOOD LOSS: 1200 mL.   COMPLICATIONS: None.  INDICATION FOR SURGERY: Mrs. Kristi Hickman is a 74 year old patient who presented with a pelvic mass which on further evaluation was noted to be complex, solid cystic and bilateral. Therefore, the decision was made to proceed with surgery.   FINDINGS AT TIME OF SURGERY: About 16-cm solid cystic air tumor rising from the right ovary. Hydrosalpinx on the right side. About 7-cm tumor rising from the left ovary. Thick, plaque-like disease over the bladder, in the cul-de-sac, and on the sigmoid colon. Small volume disease in the gutters, the diaphragm, as well as the small bowel and mesentery. Large volume nodular disease in the omentum.   Residual disease after surgery less than 1 cm in maximum diameter, many related to diffuse studding.   OPERATIVE REPORT: After adequate general anesthesia had been obtained, the patient was prepped and draped in the supine position. A midline incision was placed with a sharp knife and carried down through the fascia. The incision was extended cephalad and caudad. Exploration was performed with the above-mentioned findings. A Bookwalter retractor was placed.   Biopsies were taken from the small bowel.   Attention was then directed to the pelvis. The pelvic sidewall was entered on the right side. Vessels and ureter were identified. The infundibulopelvic ligament was clamped and cut and stitch ligated using 0 Vicryl. Then the mass was freed  by careful dissection from the right pelvic sidewall as well as adhesions to the sigmoid colon. Vessels and ureter were kept under constant visualization. The round ligament was transected in the area where it entered the tumor. Finally, the mass could be mobilized all the way down to adhesions on the vaginal apex and removed. Attention was then directed towards the left pelvic sidewall, which again was entered. Vessels and ureter were identified. The round ligament was stitch ligated and transected. Then again the infundibulopelvic ligament was clamped, cut, and ligated twice using 0 Vicryl. The tumor was freed from the sigmoid colon as well as the bladder and the vaginal apex so it could be removed completely. Residual disease on the pelvic peritoneum and on the dome of the bladder was mobilized and resected. It was felt that the tumor on the sigmoid colon could not be adequately mobilized, and therefore the decision was made to proceed with sigmoid resection. This part of the procedure is dictated by Dr. Egbert Garibaldi.   Attention was also directed towards the upper abdomen. The omentum was scarred with tumor mainly in the left upper abdomen. By careful dissection using Metzenbaum scissors and the Harmonic scalpel, the omentum was freed from the transverse colon as well as the stomach so that all tumor could be removed. Several bleeding vessels were ligated. Small serosal injuries to the transverse colon were closed using 3-0 silk.   At the end of the procedure careful irrigation was done, and after several vessels were cauterized hemostasis was noted to be adequate. Fibrin sealant was placed around the sigmoid anastomosis. A drain was placed in the mid abdomen. Lap sponges and retractors were  removed. The fascia was closed with running #1 loop PDS suture. Irrigation of the subcutaneous tissue was obtained and adequate hemostasis confirmed before it was reapproximated using 2-0 Vicryl. The skin was closed with  staples.   The patient tolerated the procedure well and was taken to the recovery room in stable condition. Postoperative urine was blue and clear.      Pad, sponge, needle, and instrument counts were correct times two.   ____________________________ Maxine GlennBrigitte E. Davinia Riccardi, MD bem:bjt D: 03/01/2011 19:08:39 ET T: 03/02/2011 08:40:39 ET JOB#: [id number]  cc: Maxine GlennBrigitte E. Talbot Monarch, MD, <Dictator> Maxine GlennBRIGITTE E Karinne Schmader MD ELECTRONICALLY SIGNED 03/08/2011 13:05

## 2014-05-11 NOTE — Op Note (Signed)
PATIENT NAME:  Trisha MangleCORBITT, Arloa MR#:  [id number] DATE OF BIRTH:  12/31/1940  DATE OF PROCEDURE:  03/01/2011  PREOPERATIVE DIAGNOSIS: Stage IIIC ovarian carcinoma.   POSTOPERATIVE DIAGNOSIS: Stage IIIC ovarian carcinoma.   PROCEDURE: Low anterior resection with stapled low colopelvic  anastomosis.   SURGEON: Raynald KempMark A Joanette Silveria, MD  CO-SURGEONRoyce Macadamia: Bridget Miller, M.D.   TYPE OF ANESTHESIA: General.   DESCRIPTION OF PROCEDURE: During the course of the operation dictated under separate cover by Dr. Hyacinth MeekerMiller, it became obvious that the sigmoid colon was involved in the malignant process. As such, the proximal sigmoid colon was divided with a fire of the contoured 40 stapler. The intervening mesentery to the colon was taken down with the Harmonic scalpel, and larger vessels between clamps and ties of 0 Vicryl suture. The courses of the right and left ureters had already been identified previously in the operation by Dr. Hyacinth MeekerMiller. The tumor was involving the anterior surface of the distal sigmoid colon. Dr. Hyacinth MeekerMiller had mobilized the anterior surface of the sigmoid colon off the tumor.  A cuff of vagina was also taken down by her which had become adherent to the colon, and this was done with sharp dissection. Distally the colon was divided with a second fire of the contoured stapler with the intervening mesentery taken between clamps and ties of 0 Vicryl and the Harmonic scalpel. The specimen was handed off the field.   The splenic flexure was then delivered along the avascular white line of [place]. One small serosal tear on the antimesenteric border of the distal bowel was closed utilizing interrupted 3-0 Lembert type sutures of 3-0 silk.   Sufficient mobilization of the colon was performed. The proximal staple line was prepared for the anastomosis by removing epiploic fat. A pursestring applicator was placed. 2-0 nylon sutures were passed on PlanoKeith needles. The staple line was excised.  The pursestring suture was  reinforced with a single 3-0 silk suture. The proximal colon easily accepted a 29-mm anvil. 29-mm ILS stapler was brought into the field. The anvil was placed into the proximal colon and the pursestring suture was tied.   I then placed a 25-mm and 29-mm dilator into the rectum. The anastomosis was at approximately 15 to 20 cm.  A 29-mm ILS stapler was then engaged into the distal rectum. The spike was driven just posterior to the staple line. The coupling mechanism was engaged. The stapler was fired. Two intact rings were identified. A small amount of air was seen coming through the anastomosis on the right anterior lateral aspect. This area was reinforced with multiple silk sutures. A piece of epiploic fat adjoining the anastomosis was draped over the area. Fibrin glue was then instilled in the area as well as into the area of dissection overlying the bladder. The remaining portion of the operation was completed by Dr. Hyacinth MeekerMiller with my assistance.    ____________________________ Redge GainerMark A. Egbert GaribaldiBird, MD mab:bjt D: 03/02/2011 08:44:14 ET T: 03/02/2011 09:46:10 ET JOB#: [id number]  cc: Loraine LericheMark A. Egbert GaribaldiBird, MD, <Dictator> Raynald KempMARK A Newell Frater MD ELECTRONICALLY SIGNED 03/03/2011 14:31

## 2014-05-11 NOTE — Discharge Summary (Signed)
PATIENT NAME:  Kristi Hickman, Kristi Hickman MR#:  [id number] DATE OF BIRTH:  April 22, 1940  DATE OF ADMISSION:  03/01/2011 DATE OF DISCHARGE:  03/09/2011   BRIEF HISTORY: Kristi Hickman is a 74 year old woman with a newly discovered pelvic tumor admitted for a debulking procedure and resection. After appropriate preoperative preparation and informed consent, she was taken to surgery on the morning of 03/01/2011 where she underwent an omentectomy, pelvic debulking, and low anterior resection. The procedure was initially uncomplicated but immediately postoperatively she had an episode of respiratory depression likely related to medication overdose. She was resuscitated with Narcan and oxygen. She avoided reintubation. She had slow return of bowel function, not passing any gas until the 18th but was able to take a liquid diet on the 18th and advance to a soft diet on the 19th. Her wounds look good. Her JP drain was removed on the 19th. She is up active, tolerating a regular diet, ambulating without difficulty. She will be discharged home today to be followed in the office in 7 to 10 days' time at the [facility] with Dr. Wendy PoetBrigitte Miller. Bathing, activity, and driving instructions were given to the patient.   DISCHARGE MEDICATIONS:  1. She is to take Ultram 50 mg q.6 hours for pain control.  2. Aspirin 81 mg p.o. daily.  3. Clobestasol 0.05% topical cream. 4. Hydrochlorothiazide/triamterene 25/37.5 p.o. daily.  5. Ibuprofen 200 mg q.4 hours p.r.n.  6. Levothyroxine 0.112 mg p.o. daily.  7. Pravastatin 40 mg p.o. daily.  8. Fosamax 70 mg p.o. q. week.   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   SURGERY:  1. Exploratory laparotomy. 2. Bilateral salpingo-oophorectomy. 3. Tumor reduction surgery.    4. Omentectomy. 5. Sigmoid colectomy.   ____________________________ Quentin Orealph L. Ely III, MD rle:drc D: 03/09/2011 12:16:56 ET T: 03/09/2011 12:28:09 ET JOB#: [id number]  cc: Carmie Endalph  L. Ely III, MD, <Dictator> Coralyn Pearanica M. Glass, FNP Maxine GlennBrigitte E. Miller, MD Quentin OreALPH L ELY MD ELECTRONICALLY SIGNED 03/09/2011 15:45

## 2014-05-11 NOTE — Op Note (Signed)
PATIENT NAME:  Kristi Hickman, [name] MR#:  [id number] DATE OF BIRTH:  02-Aug-1940  DATE OF PROCEDURE:  03/17/2011  PREOPERATIVE DIAGNOSIS: GYN endometrial carcinoma.   POSTOPERATIVE DIAGNOSIS: GYN endometrial carcinoma.   PROCEDURE: Port-A-Cath placement.   SURGEON: Quentin Orealph L. Ely III, MD  ANESTHESIA: Monitored anesthetic care.    DESCRIPTION OF PROCEDURE: With the patient in supine position after induction of appropriate intravenous sedation, the patient's right neck and chest were prepped with ChloraPrep and draped with sterile towels. Neck was interrogated with the ultrasound. Did appear to be a large compressible, patent right internal jugular vein. This area was anesthetized with 1% Xylocaine buffered with sodium bicarbonate and a small incision made. Under direct vision with the ultrasound the right internal jugular vein was cannulated on a single pass and a wire passed into the right heart under fluoroscopic control. The wire was secured while a second incision was created on the chest wall in a transverse fashion using a suprafascial subcutaneous pocket for the Port-A-Cath device. Vein dilator and introducer were inserted over the wire and the wire and dilator removed. Heparin filled catheter was inserted through the introducer into the great vessel system and positioned in the right heart using Conray under fluoroscopic control. It was then tunneled to a second incision where it was attached to a heparin filled Port-A-Cath device. It was sutured to the chest wall, flushed with heparinized saline then Conray was used to confirm the absence of any kinks or leaks and appropriate position in the great vessel system. It was then flushed again with heparinized saline. Subcutaneous space obliterated with 3-0 Vicryl and skin was closed with 4-0 nylon in a running vertical mattress fashion. Sterile dressings were applied. The patient returned to recovery room having tolerated procedure well. Sponge, instrument,  and needle counts were correct x2 in the Operating Room.   ____________________________ Quentin Orealph L. Ely III, MD rle:cms D: 03/17/2011 14:17:35 ET T: 03/17/2011 15:11:09 ET  JOB#: [id number] cc: Coralyn Pearanica M. Glass, FNP Maxine GlennBrigitte E. Miller, MD Quentin OreALPH L ELY MD ELECTRONICALLY SIGNED 03/18/2011 22:30
